# Patient Record
Sex: Male | Born: 1935 | Race: White | Hispanic: No | Smoking: Never smoker
Health system: Southern US, Community
[De-identification: ages and names within clinical notes are randomized; demographics above are authoritative.]

## PROBLEM LIST (undated history)

## (undated) DIAGNOSIS — I10 Essential (primary) hypertension: Secondary | ICD-10-CM

## (undated) DIAGNOSIS — I350 Nonrheumatic aortic (valve) stenosis: Secondary | ICD-10-CM

## (undated) DIAGNOSIS — I712 Thoracic aortic aneurysm, without rupture, unspecified: Secondary | ICD-10-CM

## (undated) DIAGNOSIS — E7800 Pure hypercholesterolemia, unspecified: Secondary | ICD-10-CM

## (undated) DIAGNOSIS — H409 Unspecified glaucoma: Secondary | ICD-10-CM

## (undated) DIAGNOSIS — N400 Enlarged prostate without lower urinary tract symptoms: Secondary | ICD-10-CM

## (undated) DIAGNOSIS — I499 Cardiac arrhythmia, unspecified: Secondary | ICD-10-CM

## (undated) DIAGNOSIS — K219 Gastro-esophageal reflux disease without esophagitis: Secondary | ICD-10-CM

## (undated) DIAGNOSIS — R011 Cardiac murmur, unspecified: Secondary | ICD-10-CM

## (undated) DIAGNOSIS — R931 Abnormal findings on diagnostic imaging of heart and coronary circulation: Secondary | ICD-10-CM

## (undated) HISTORY — DX: Gastro-esophageal reflux disease without esophagitis: K21.9

## (undated) HISTORY — DX: Benign prostatic hyperplasia without lower urinary tract symptoms: N40.0

## (undated) HISTORY — PX: APPENDECTOMY: SHX54

## (undated) HISTORY — DX: Thoracic aortic aneurysm, without rupture, unspecified: I71.20

## (undated) HISTORY — DX: Nonrheumatic aortic (valve) stenosis: I35.0

## (undated) HISTORY — DX: Abnormal findings on diagnostic imaging of heart and coronary circulation: R93.1

## (undated) HISTORY — PX: EYE SURGERY: SHX253

## (undated) HISTORY — DX: Cardiac arrhythmia, unspecified: I49.9

## (undated) HISTORY — DX: Unspecified glaucoma: H40.9

## (undated) HISTORY — DX: Cardiac murmur, unspecified: R01.1

## (undated) HISTORY — DX: Thoracic aortic aneurysm, without rupture: I71.2

## (undated) HISTORY — DX: Pure hypercholesterolemia, unspecified: E78.00

---

## 2006-08-29 ENCOUNTER — Encounter: Admission: RE | Admit: 2006-08-29 | Discharge: 2006-08-29 | Payer: Self-pay | Admitting: Orthopaedic Surgery

## 2009-02-17 ENCOUNTER — Encounter: Admission: RE | Admit: 2009-02-17 | Discharge: 2009-02-17 | Payer: Self-pay | Admitting: Cardiology

## 2010-12-17 ENCOUNTER — Encounter: Payer: Self-pay | Admitting: Orthopaedic Surgery

## 2011-09-03 ENCOUNTER — Inpatient Hospital Stay (INDEPENDENT_AMBULATORY_CARE_PROVIDER_SITE_OTHER)
Admission: RE | Admit: 2011-09-03 | Discharge: 2011-09-03 | Disposition: A | Discharge status: Home / Self Care | Payer: Medicare Other | Source: Ambulatory Visit | Attending: Emergency Medicine | Admitting: Emergency Medicine

## 2011-09-03 DIAGNOSIS — T148XXA Other injury of unspecified body region, initial encounter: Secondary | ICD-10-CM

## 2012-06-12 ENCOUNTER — Emergency Department (HOSPITAL_COMMUNITY)
Admission: EM | Admit: 2012-06-12 | Discharge: 2012-06-12 | Disposition: A | Discharge status: Home / Self Care | Payer: Medicare Other | Source: Home / Self Care

## 2012-06-12 ENCOUNTER — Encounter (HOSPITAL_COMMUNITY): Payer: Self-pay | Admitting: Emergency Medicine

## 2012-06-12 ENCOUNTER — Emergency Department (INDEPENDENT_AMBULATORY_CARE_PROVIDER_SITE_OTHER): Payer: Medicare Other

## 2012-06-12 DIAGNOSIS — R05 Cough: Secondary | ICD-10-CM

## 2012-06-12 DIAGNOSIS — J069 Acute upper respiratory infection, unspecified: Secondary | ICD-10-CM

## 2012-06-12 HISTORY — DX: Essential (primary) hypertension: I10

## 2012-06-12 MED ORDER — AZITHROMYCIN 250 MG PO TABS: 250.0000 mg | ORAL_TABLET | Freq: Every day | ORAL | Status: AC

## 2012-06-12 NOTE — ED Notes (Signed)
HERE WITH URI SX DRY COUGH AND CHEST CONGESTION,CHILLS X 10 DYS UNRELIEVED BY OTC ALKA SELTZER AND BAYER ASP.DENIES SOB OR CP

## 2012-06-12 NOTE — ED Provider Notes (Signed)
History     CSN: [id number]  Arrival date & time 06/12/12  1130   None     Chief Complaint  Patient presents with  . URI    (Consider location/radiation/quality/duration/timing/severity/associated sxs/prior treatment) The history is provided by the patient.  Keegan is a 78 y.o. male who complains of onset of cold symptoms for ten days.  scratchy sore throat + cough, non productive No pleuritic pain No wheezing + nasal congestion + post-nasal drainage No sinus pain/pressure No chest congestion No itchy/red eyes No earache No hemoptysis No SOB + chills/sweats No fever No nausea No vomiting No abdominal pain No diarrhea No skin rashes No fatigue + myalgias No headache  Reports taking aspirin for night sweats and chills the last four days.  Denies chest pain, sob, nonsmoker. Past Medical History  Diagnosis Date  . Hypertension     History reviewed. No pertinent past surgical history.  No family history on file.  History  Substance Use Topics  . Smoking status: Never Smoker   . Smokeless tobacco: Not on file  . Alcohol Use: No      Review of Systems  All other systems reviewed and are negative.    Allergies  Review of patient's allergies indicates no known allergies.  Home Medications   Current Outpatient Rx  Name Route Sig Dispense Refill  . AMLODIPINE BESYLATE 5 MG PO TABS Oral Take 5 mg by mouth daily.    Marland Kitchen FINASTERIDE 5 MG PO TABS Oral Take 5 mg by mouth daily.    Marland Kitchen LISINOPRIL 20 MG PO TABS Oral Take 20 mg by mouth daily.    Marland Kitchen METOPROLOL SUCCINATE ER 25 MG PO TB24 Oral Take 25 mg by mouth daily.    Marland Kitchen RANITIDINE HCL 300 MG PO TABS Oral Take 300 mg by mouth at bedtime.    . TRAVOPROST 0.004 % OP SOLN  1 drop at bedtime.      BP 172/77  Pulse 60  Temp 98.2 F (36.8 C) (Oral)  Resp 20  SpO2 98%  Physical Exam  Nursing note and vitals reviewed. Constitutional: He is oriented to person, place, and time. Vital signs are normal. He  appears well-developed and well-nourished. He is active and cooperative.  HENT:  Head: Normocephalic.  Right Ear: Hearing, tympanic membrane, external ear and ear canal normal.  Left Ear: Hearing, tympanic membrane, external ear and ear canal normal.  Nose: Rhinorrhea present.  Mouth/Throat: Uvula is midline and mucous membranes are normal. Posterior oropharyngeal erythema present.       Postnasal drip  Eyes: Conjunctivae are normal. Pupils are equal, round, and reactive to light. No scleral icterus.  Neck: Trachea normal. Neck supple.  Cardiovascular: Normal rate and regular rhythm.   Murmur heard. Pulmonary/Chest: Effort normal. He has wheezes in the left lower field. He has rhonchi in the right middle field and the right lower field.  Abdominal: Soft. Normal appearance and bowel sounds are normal. He exhibits no distension. There is no hepatosplenomegaly. There is no tenderness.  Lymphadenopathy:       Head (right side): No submandibular, no tonsillar, no preauricular, no posterior auricular and no occipital adenopathy present.       Head (left side): No submandibular, no tonsillar, no preauricular, no posterior auricular and no occipital adenopathy present.    He has no cervical adenopathy.  Neurological: He is alert and oriented to person, place, and time. No cranial nerve deficit or sensory deficit.  Skin: Skin is warm and dry.  Psychiatric: He has a normal mood and affect. His speech is normal and behavior is normal. Judgment and thought content normal. Cognition and memory are normal.    ED Course  Procedures (including critical care time)  Labs Reviewed - No data to display Dg Chest 2 View  06/12/2012  *RADIOLOGY REPORT*  Clinical Data: Cough for 10 days  CHEST - 2 VIEW  Comparison: CT chest of 02/17/2009  Findings: No active infiltrate or effusion is seen with slight hyperaeration present.  Mild cardiomegaly is stable.  There is little change in the dilatation of the aortic arch  which appears stable compared to the CT from 2010 with ectasia of the descending thoracic aorta as well.  There are diffuse degenerative changes throughout the thoracic spine.  IMPRESSION: Stable cardiomegaly with no change in the ectatic aortic arch and descending thoracic aorta.  No active process.  Original Report Authenticated By: Juline Patch, M.D.     1. Cough       MDM  Increase fluid intake, rest.  Begin Azithromycin.  Begin expectorant/decongestant, topical decongestant, and cough suppressant at bedtime. Antihistamines of your choice (Claritin or Zyrtec).  Tylenol or Motrin for fever/discomfort.  Followup with PCP if not improving 7 to 10 days.   Johnsie Kindred, NP 06/12/12 1355

## 2012-06-13 NOTE — ED Provider Notes (Signed)
Medical screening examination/treatment/procedure(s) were performed by non-physician practitioner and as supervising physician I was immediately available for consultation/collaboration.  Kaci Freel M. MD   Kaaliyah Kita M Icholas Irby, MD 06/13/12 2102 

## 2013-03-06 ENCOUNTER — Other Ambulatory Visit (HOSPITAL_COMMUNITY): Payer: Self-pay | Admitting: Podiatry

## 2013-03-06 DIAGNOSIS — M79609 Pain in unspecified limb: Secondary | ICD-10-CM

## 2013-03-06 DIAGNOSIS — R0989 Other specified symptoms and signs involving the circulatory and respiratory systems: Secondary | ICD-10-CM

## 2013-03-10 ENCOUNTER — Ambulatory Visit (HOSPITAL_COMMUNITY)
Admission: RE | Admit: 2013-03-10 | Discharge: 2013-03-10 | Disposition: A | Discharge status: Home / Self Care | Payer: Medicare Other | Source: Ambulatory Visit | Attending: Cardiovascular Disease | Admitting: Cardiovascular Disease

## 2013-03-10 DIAGNOSIS — R0989 Other specified symptoms and signs involving the circulatory and respiratory systems: Secondary | ICD-10-CM

## 2013-03-10 DIAGNOSIS — M79609 Pain in unspecified limb: Secondary | ICD-10-CM

## 2013-03-10 NOTE — Progress Notes (Signed)
Arterial Duplex Lower Ext. Completed. Kenndra Morris D  

## 2013-05-09 ENCOUNTER — Other Ambulatory Visit: Payer: Self-pay | Admitting: Neurosurgery

## 2013-05-09 DIAGNOSIS — M48061 Spinal stenosis, lumbar region without neurogenic claudication: Secondary | ICD-10-CM

## 2013-05-16 ENCOUNTER — Ambulatory Visit
Admission: RE | Admit: 2013-05-16 | Discharge: 2013-05-16 | Disposition: A | Discharge status: Home / Self Care | Payer: Medicare Other | Source: Ambulatory Visit | Attending: Neurosurgery | Admitting: Neurosurgery

## 2013-05-16 DIAGNOSIS — M48061 Spinal stenosis, lumbar region without neurogenic claudication: Secondary | ICD-10-CM

## 2013-12-18 ENCOUNTER — Encounter: Payer: Self-pay | Admitting: Podiatry

## 2013-12-18 ENCOUNTER — Ambulatory Visit (INDEPENDENT_AMBULATORY_CARE_PROVIDER_SITE_OTHER): Payer: Medicare Other | Admitting: Podiatry

## 2013-12-18 VITALS — BP 144/84 | HR 61 | Resp 18

## 2013-12-18 DIAGNOSIS — L97509 Non-pressure chronic ulcer of other part of unspecified foot with unspecified severity: Secondary | ICD-10-CM

## 2013-12-18 NOTE — Progress Notes (Signed)
Last Saturday that knot got real bad , Dr. Al CorpusHyatt his work on it before and it really hasn't bothered me since the last time. As of late has been more painful.  Objective: Vital signs are stable alert and oriented x3. Pulses are strongly palpable left lower extremity. Very prominent fifth metatarsal base and hypertrophic fifth metatarsal base plantarly resulting in soft tissue breakdown plantar aspect of the left foot. Mild erythema no edema cellulitis or odor does mild drainage in necrotic tissue I debrided this area today he did did not probe to bone I see no signs of osteomyelitis.  Assessment: Debridement of wound today sub-fifth metatarsal of the left foot. Ulceration.  Plan: Debridement of the wound today redressed with a dry sterile compressive dressing he was start conservative therapies and I will followup with him in 2-3 weeks

## 2014-01-15 ENCOUNTER — Ambulatory Visit (INDEPENDENT_AMBULATORY_CARE_PROVIDER_SITE_OTHER): Payer: Medicare Other | Admitting: Podiatry

## 2014-01-15 VITALS — BP 127/71 | HR 75 | Resp 16 | Ht 66.0 in | Wt 142.0 lb

## 2014-01-15 DIAGNOSIS — L97509 Non-pressure chronic ulcer of other part of unspecified foot with unspecified severity: Secondary | ICD-10-CM

## 2014-01-15 NOTE — Progress Notes (Signed)
He presents today for a followup of his ulceration sub-fifth metatarsal base of his left foot. It appears to be much improved. There is no erythema edema saline is drainage or odor on physical exam today just reactive hyperkeratosis which I debrided to bleeding today. 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  Plan: Debridement of wound today followup with me on an as-needed basis.

## 2014-02-24 ENCOUNTER — Encounter (HOSPITAL_COMMUNITY): Payer: Self-pay | Admitting: Emergency Medicine

## 2014-02-24 ENCOUNTER — Emergency Department (HOSPITAL_COMMUNITY)
Admission: EM | Admit: 2014-02-24 | Discharge: 2014-02-24 | Disposition: A | Discharge status: Home / Self Care | Payer: Medicare Other | Source: Home / Self Care | Attending: Emergency Medicine | Admitting: Emergency Medicine

## 2014-02-24 DIAGNOSIS — L02619 Cutaneous abscess of unspecified foot: Secondary | ICD-10-CM

## 2014-02-24 DIAGNOSIS — L03119 Cellulitis of unspecified part of limb: Secondary | ICD-10-CM

## 2014-02-24 DIAGNOSIS — L0390 Cellulitis, unspecified: Secondary | ICD-10-CM

## 2014-02-24 DIAGNOSIS — L02612 Cutaneous abscess of left foot: Secondary | ICD-10-CM

## 2014-02-24 MED ORDER — AMOXICILLIN-POT CLAVULANATE 875-125 MG PO TABS: 1.0000 | ORAL_TABLET | Freq: Two times a day (BID) | ORAL | Status: DC

## 2014-02-24 MED ORDER — HYDROCODONE-ACETAMINOPHEN 5-325 MG PO TABS
1.0000 | ORAL_TABLET | ORAL | Status: DC | PRN
Start: 2014-02-24 — End: 2014-03-03

## 2014-02-24 NOTE — ED Notes (Signed)
Patient has a "knot" to the side of his left foot Foot is red swollen and painful

## 2014-02-24 NOTE — Discharge Instructions (Signed)
Abscess An abscess is an infected area that contains a collection of pus and debris.It can occur in almost any part of the body. An abscess is also known as a furuncle or boil. CAUSES  An abscess occurs when tissue gets infected. This can occur from blockage of oil or sweat glands, infection of hair follicles, or a minor injury to the skin. As the body tries to fight the infection, pus collects in the area and creates pressure under the skin. This pressure causes pain. People with weakened immune systems have difficulty fighting infections and get certain abscesses more often.  SYMPTOMS Usually an abscess develops on the skin and becomes a painful mass that is red, warm, and tender. If the abscess forms under the skin, you may feel a moveable soft area under the skin. Some abscesses break open (rupture) on their own, but most will continue to get worse without care. The infection can spread deeper into the body and eventually into the bloodstream, causing you to feel ill.  DIAGNOSIS  Your caregiver will take your medical history and perform a physical exam. A sample of fluid may also be taken from the abscess to determine what is causing your infection. TREATMENT  Your caregiver may prescribe antibiotic medicines to fight the infection. However, taking antibiotics alone usually does not cure an abscess. Your caregiver may need to make a small cut (incision) in the abscess to drain the pus. In some cases, gauze is packed into the abscess to reduce pain and to continue draining the area. HOME CARE INSTRUCTIONS   Only take over-the-counter or prescription medicines for pain, discomfort, or fever as directed by your caregiver.  If you were prescribed antibiotics, take them as directed. Finish them even if you start to feel better.  If gauze is used, follow your caregiver's directions for changing the gauze.  To avoid spreading the infection:  Keep your draining abscess covered with a  bandage.  Wash your hands well.  Do not share personal care items, towels, or whirlpools with others.  Avoid skin contact with others.  Keep your skin and clothes clean around the abscess.  Keep all follow-up appointments as directed by your caregiver. SEEK MEDICAL CARE IF:   You have increased pain, swelling, redness, fluid drainage, or bleeding.  You have muscle aches, chills, or a general ill feeling.  You have a fever. MAKE SURE YOU:   Understand these instructions.  Will watch your condition.  Will get help right away if you are not doing well or get worse. Document Released: 08/23/2005 Document Revised: 05/14/2012 Document Reviewed: 01/26/2012 Exit[employer] Patient Information 2014 [employer]. Abscess Care After An abscess (also called a boil or furuncle) is an infected area that contains a collection of pus. Signs and symptoms of an abscess include pain, tenderness, redness, or hardness, or you may feel a moveable soft area under your skin. An abscess can occur anywhere in the body. The infection may spread to surrounding tissues causing cellulitis. A cut (incision) by the surgeon was made over your abscess and the pus was drained out. Gauze may have been packed into the space to provide a drain that will allow the cavity to heal from the inside outwards. The boil may be painful for 5 to 7 days. Most people with a boil do not have high fevers. Your abscess, if seen early, may not have localized, and may not have been lanced. If not, another appointment may be required for this if it does not   get better on its own or with medications. HOME CARE INSTRUCTIONS   Only take over-the-counter or prescription medicines for pain, discomfort, or fever as directed by your caregiver.  When you bathe, soak and then remove gauze or iodoform packs at least daily or as directed by your caregiver. You may then wash the wound gently with mild soapy water. Repack with gauze or do as your  caregiver directs. SEEK IMMEDIATE MEDICAL CARE IF:   You develop increased pain, swelling, redness, drainage, or bleeding in the wound site.  You develop signs of generalized infection including muscle aches, chills, fever, or a general ill feeling.  An oral temperature above 102 F (38.9 C) develops, not controlled by medication. See your caregiver for a recheck if you develop any of the symptoms described above. If medications (antibiotics) were prescribed, take them as directed. Document Released: 06/01/2005 Document Revised: 02/05/2012 Document Reviewed: 01/27/2008 Exit[employer] Patient Information 2014 [employer]. Cellulitis Cellulitis is an infection of the skin and the tissue beneath it. The infected area is usually red and tender. Cellulitis occurs most often in the arms and lower legs.  CAUSES  Cellulitis is caused by bacteria that enter the skin through cracks or cuts in the skin. The most common types of bacteria that cause cellulitis are Staphylococcus and Streptococcus. SYMPTOMS   Redness and warmth.  Swelling.  Tenderness or pain.  Fever. DIAGNOSIS  Your caregiver can usually determine what is wrong based on a physical exam. Blood tests may also be done. TREATMENT  Treatment usually involves taking an antibiotic medicine. HOME CARE INSTRUCTIONS   Take your antibiotics as directed. Finish them even if you start to feel better.  Keep the infected arm or leg elevated to reduce swelling.  Apply a warm cloth to the affected area up to 4 times per day to relieve pain.  Only take over-the-counter or prescription medicines for pain, discomfort, or fever as directed by your caregiver.  Keep all follow-up appointments as directed by your caregiver. SEEK MEDICAL CARE IF:   You notice red streaks coming from the infected area.  Your red area gets larger or turns dark in color.  Your bone or joint underneath the infected area becomes painful after the skin has  healed.  Your infection returns in the same area or another area.  You notice a swollen bump in the infected area.  You develop new symptoms. SEEK IMMEDIATE MEDICAL CARE IF:   You have a fever.  You feel very sleepy.  You develop vomiting or diarrhea.  You have a general ill feeling (malaise) with muscle aches and pains. MAKE SURE YOU:   Understand these instructions.  Will watch your condition.  Will get help right away if you are not doing well or get worse. Document Released: 08/23/2005 Document Revised: 05/14/2012 Document Reviewed: 01/29/2012 ExitCare Patient Information 2014 [employer].  

## 2014-02-24 NOTE — ED Provider Notes (Signed)
CSN: [id number]     Arrival date & time 02/24/14  [id number] History   First MD Initiated Contact with Patient 02/24/14 2128     Chief Complaint  Patient presents with  . Foot Pain   (Consider location/radiation/quality/duration/timing/severity/associated sxs/prior Treatment) HPI Comments: C/O pain left foot with redness and puffiness to chronic lesion for 1 week   Past Medical History  Diagnosis Date  . Hypertension    History reviewed. No pertinent past surgical history. No family history on file. History  Substance Use Topics  . Smoking status: Never Smoker   . Smokeless tobacco: Not on file  . Alcohol Use: No    Review of Systems  Constitutional: Negative.   Skin:       As above    Allergies  Review of patient's allergies indicates no known allergies.  Home Medications   Current Outpatient Rx  Name  Route  Sig  Dispense  Refill  . amLODipine (NORVASC) 5 MG tablet   Oral   Take 5 mg by mouth daily.         Marland Kitchen. amoxicillin-clavulanate (AUGMENTIN) 875-125 MG per tablet   Oral   Take 1 tablet by mouth every 12 (twelve) hours.   14 tablet   0   . dutasteride (AVODART) 0.5 MG capsule   Oral   Take 0.5 mg by mouth daily.         . finasteride (PROSCAR) 5 MG tablet   Oral   Take 5 mg by mouth daily.         Marland Kitchen. HYDROcodone-acetaminophen (NORCO/VICODIN) 5-325 MG per tablet   Oral   Take 1 tablet by mouth every 4 (four) hours as needed.   15 tablet   0   . lisinopril (PRINIVIL,ZESTRIL) 20 MG tablet   Oral   Take 20 mg by mouth daily.         . metoprolol succinate (TOPROL-XL) 25 MG 24 hr tablet   Oral   Take 25 mg by mouth daily.         . pantoprazole (PROTONIX) 40 MG tablet   Oral   Take 40 mg by mouth daily.         . ranitidine (ZANTAC) 300 MG tablet   Oral   Take 300 mg by mouth at bedtime.         . travoprost, benzalkonium, (TRAVATAN) 0.004 % ophthalmic solution      1 drop at bedtime.          BP 151/99  Pulse 100  Temp(Src)  97.3 F (36.3 C) (Oral)  Resp 16  SpO2 100% Physical Exam  Nursing note and vitals reviewed. Constitutional: He is oriented to person, place, and time. He appears well-developed and well-nourished. No distress.  Neurological: He is alert and oriented to person, place, and time.  Skin: Skin is warm and dry.  Ball shaped lesion to the left lateral foot for 30 yrs, Now with fluid and mild swelling to foot with lymphangitis.    ED Course  INCISION AND DRAINAGE Date/Time: 02/24/2014 10:21 PM Performed by: Phineas RealMABE, Raymonde Hamblin Authorized by: Leslee HomeKELLER, Lambros Cerro C Consent: Verbal consent obtained. Risks and benefits: risks, benefits and alternatives were discussed Consent given by: patient Patient understanding: patient states understanding of the procedure being performed Patient identity confirmed: verbally with patient Type: abscess Body area: lower extremity Location details: left foot Anesthesia: local infiltration Local anesthetic: lidocaine 2% with epinephrine Anesthetic total: 3 ml Patient sedated: no Scalpel size: 11 Incision type: single  straight Complexity: simple Drainage: purulent and  bloody Drainage amount: moderate Wound treatment: wound left open Packing material: none Patient tolerance: Patient tolerated the procedure well with no immediate complications.   (including critical care time) Labs Review Labs Reviewed  CULTURE, ROUTINE-ABSCESS   Imaging Review No results found.   MDM   1. Abscess of left foot   2. Cellulitis      Bandage tonight Warm water soaks tomorrow aUgmentin 875 bid norco 5 mg #15 prn If worse rechk promptly Culture pending  Hayden Rasmussen, NP 02/24/14 2222

## 2014-02-24 NOTE — ED Provider Notes (Signed)
Medical screening examination/treatment/procedure(s) were performed by non-physician practitioner and as supervising physician I was immediately available for consultation/collaboration.  Malakai Schoenherr, M.D.  Chrisandra Wiemers C Amyre Segundo, MD 02/24/14 2346 

## 2014-02-26 ENCOUNTER — Inpatient Hospital Stay (HOSPITAL_COMMUNITY)
Admission: AD | Admit: 2014-02-26 | Discharge: 2014-03-04 | DRG: 501 | Disposition: A | Discharge status: Home / Self Care | Payer: Medicare Other | Source: Ambulatory Visit | Attending: Orthopaedic Surgery | Admitting: Orthopaedic Surgery

## 2014-02-26 DIAGNOSIS — Z79899 Other long term (current) drug therapy: Secondary | ICD-10-CM

## 2014-02-26 DIAGNOSIS — L02619 Cutaneous abscess of unspecified foot: Secondary | ICD-10-CM | POA: Diagnosis present

## 2014-02-26 DIAGNOSIS — L03119 Cellulitis of unspecified part of limb: Secondary | ICD-10-CM

## 2014-02-26 DIAGNOSIS — R2242 Localized swelling, mass and lump, left lower limb: Secondary | ICD-10-CM | POA: Diagnosis present

## 2014-02-26 DIAGNOSIS — M799 Soft tissue disorder, unspecified: Principal | ICD-10-CM | POA: Diagnosis present

## 2014-02-26 DIAGNOSIS — I10 Essential (primary) hypertension: Secondary | ICD-10-CM | POA: Diagnosis present

## 2014-02-26 LAB — CBC WITH DIFFERENTIAL/PLATELET
BASOS ABS: 0 10*3/uL (ref 0.0–0.1)
Basophils Relative: 0 % (ref 0–1)
EOS ABS: 0.1 10*3/uL (ref 0.0–0.7)
EOS PCT: 2 % (ref 0–5)
HCT: 35.2 % — ABNORMAL LOW (ref 39.0–52.0)
Hemoglobin: 12.3 g/dL — ABNORMAL LOW (ref 13.0–17.0)
Lymphocytes Relative: 16 % (ref 12–46)
Lymphs Abs: 1.4 10*3/uL (ref 0.7–4.0)
MCH: 29 pg (ref 26.0–34.0)
MCHC: 34.9 g/dL (ref 30.0–36.0)
MCV: 83 fL (ref 78.0–100.0)
Monocytes Absolute: 1.4 10*3/uL — ABNORMAL HIGH (ref 0.1–1.0)
Monocytes Relative: 16 % — ABNORMAL HIGH (ref 3–12)
NEUTROS PCT: 66 % (ref 43–77)
Neutro Abs: 5.8 10*3/uL (ref 1.7–7.7)
PLATELETS: 179 10*3/uL (ref 150–400)
RBC: 4.24 MIL/uL (ref 4.22–5.81)
RDW: 14.6 % (ref 11.5–15.5)
WBC: 8.7 10*3/uL (ref 4.0–10.5)

## 2014-02-26 MED ORDER — SORBITOL 70 % SOLN
30.0000 mL | Freq: Every day | Status: DC | PRN
Start: 2014-02-26 — End: 2014-03-04

## 2014-02-26 MED ORDER — VANCOMYCIN HCL 10 G IV SOLR
1250.0000 mg | Freq: Once | INTRAVENOUS | Status: AC
  Administered 2014-02-26: 1250 mg via INTRAVENOUS
  Filled 2014-02-26: qty 1250

## 2014-02-26 MED ORDER — SENNA 8.6 MG PO TABS
1.0000 | ORAL_TABLET | Freq: Two times a day (BID) | ORAL | Status: DC
  Administered 2014-02-27 – 2014-03-04 (×9): 8.6 mg via ORAL
  Filled 2014-02-26 (×13): qty 1

## 2014-02-26 MED ORDER — MAGNESIUM CITRATE PO SOLN: 1.0000 | Freq: Once | ORAL | Status: AC | PRN

## 2014-02-26 MED ORDER — ONDANSETRON HCL 4 MG/2ML IJ SOLN
4.0000 mg | Freq: Four times a day (QID) | INTRAMUSCULAR | Status: DC | PRN
  Administered 2014-03-03 (×2): 4 mg via INTRAVENOUS
  Filled 2014-02-26 (×5): qty 2

## 2014-02-26 MED ORDER — HYDROCODONE-ACETAMINOPHEN 5-325 MG PO TABS
1.0000 | ORAL_TABLET | ORAL | Status: DC | PRN
  Administered 2014-03-02: 2 via ORAL
  Administered 2014-03-03: 1 via ORAL
  Administered 2014-03-03: 2 via ORAL
  Filled 2014-02-26: qty 1
  Filled 2014-02-26 (×2): qty 2

## 2014-02-26 MED ORDER — ONDANSETRON HCL 4 MG PO TABS
4.0000 mg | ORAL_TABLET | Freq: Four times a day (QID) | ORAL | Status: DC | PRN
  Administered 2014-02-27 – 2014-03-02 (×2): 4 mg via ORAL
  Filled 2014-02-26 (×3): qty 1

## 2014-02-26 MED ORDER — DIPHENHYDRAMINE HCL 12.5 MG/5ML PO ELIX: 25.0000 mg | ORAL_SOLUTION | ORAL | Status: DC | PRN

## 2014-02-26 MED ORDER — METOCLOPRAMIDE HCL 10 MG PO TABS: 5.0000 mg | ORAL_TABLET | Freq: Three times a day (TID) | ORAL | Status: DC | PRN

## 2014-02-26 MED ORDER — SODIUM CHLORIDE 0.9 % IV SOLN
INTRAVENOUS | Status: DC
  Administered 2014-02-26 – 2014-03-04 (×3): via INTRAVENOUS

## 2014-02-26 MED ORDER — MORPHINE SULFATE 2 MG/ML IJ SOLN
1.0000 mg | INTRAMUSCULAR | Status: DC | PRN
  Administered 2014-03-03: 1 mg via INTRAVENOUS
  Filled 2014-02-26: qty 1

## 2014-02-26 MED ORDER — POLYETHYLENE GLYCOL 3350 17 G PO PACK: 17.0000 g | PACK | Freq: Every day | ORAL | Status: DC | PRN

## 2014-02-26 MED ORDER — METOCLOPRAMIDE HCL 5 MG/ML IJ SOLN
5.0000 mg | Freq: Three times a day (TID) | INTRAMUSCULAR | Status: DC | PRN
  Administered 2014-03-03 (×2): 10 mg via INTRAVENOUS
  Filled 2014-02-26 (×2): qty 2

## 2014-02-26 MED ORDER — OXYCODONE HCL 5 MG PO TABS
5.0000 mg | ORAL_TABLET | ORAL | Status: DC | PRN
  Administered 2014-03-02: 10 mg via ORAL

## 2014-02-26 NOTE — Progress Notes (Signed)
Orthopedic Tech Progress Note Patient Details:  Jorge Long 05/20/1933 [id number]  Ortho Devices Ortho Device/Splint Location: trapeze bar patient helper Ortho Device/Splint Interventions: Application   Nikki Domrawford, Lilley Hubble 02/26/2014, 3:59 PM

## 2014-02-26 NOTE — H&P (Addendum)
   ORTHOPAEDIC HISTORY AND PHYSICAL   Chief Complaint: left foot mass  HPI: Jorge Long is a 80 y.o. male who complains of left foot mass for the last year that's been drained multiple times by a podiatrist and urgent care.  Comes in with recurrent mass with drainage.  Denies any recent trauma to the foot.  Endorses worsening redness of foot and chills.    Past Medical History  Diagnosis Date  . Hypertension    No past surgical history on file. History   Social History  . Marital Status: Widowed    Spouse Name: N/A    Number of Children: N/A  . Years of Education: N/A   Social History Main Topics  . Smoking status: Never Smoker   . Smokeless tobacco: Not on file  . Alcohol Use: No  . Drug Use: No  . Sexual Activity: Not on file   Other Topics Concern  . Not on file   Social History Narrative  . No narrative on file   No family history on file. No Known Allergies Prior to Admission medications   Medication Sig Start Date End Date Taking? Authorizing Provider  amLODipine (NORVASC) 5 MG tablet Take 5 mg by mouth daily.   Yes Historical Provider, MD  amoxicillin-clavulanate (AUGMENTIN) 875-125 MG per tablet Take 1 tablet by mouth every 12 (twelve) hours. 02/24/14  Yes Hayden Rasmussenavid Mabe, NP  dutasteride (AVODART) 0.5 MG capsule Take 0.5 mg by mouth daily.   Yes Historical Provider, MD  finasteride (PROSCAR) 5 MG tablet Take 5 mg by mouth daily.   Yes Historical Provider, MD  HYDROcodone-acetaminophen (NORCO/VICODIN) 5-325 MG per tablet Take 1 tablet by mouth every 4 (four) hours as needed. 02/24/14  Yes Hayden Rasmussenavid Mabe, NP  lisinopril (PRINIVIL,ZESTRIL) 20 MG tablet Take 20 mg by mouth daily.   Yes Historical Provider, MD  metoprolol succinate (TOPROL-XL) 25 MG 24 hr tablet Take 25 mg by mouth daily.   Yes Historical Provider, MD  ranitidine (ZANTAC) 300 MG tablet Take 300 mg by mouth at bedtime.   Yes Historical Provider, MD  travoprost, benzalkonium, (TRAVATAN) 0.004 % ophthalmic  solution 1 drop at bedtime.   Yes Historical Provider, MD   No results found.  Positive ROS: All other systems have been reviewed and were otherwise negative with the exception of those mentioned in the HPI and as above.  Physical Exam: General: Alert, no acute distress Cardiovascular: No pedal edema Respiratory: No cyanosis, no use of accessory musculature GI: No organomegaly, abdomen is soft and non-tender Skin: No lesions in the area of chief complaint Neurologic: Sensation intact distally Psychiatric: Patient is competent for consent with normal mood and affect Lymphatic: No axillary or cervical lymphadenopathy  MUSCULOSKELETAL:   Left foot - cellulitis - wwp, nvi - large firm mass on the lateral border of foot with clear drainage - painful to palpation  Assessment: Left foot mass  Plan: - XRs from office c/w cortical thickening of 5th MT - will start IV abx for cellulitis - MRI of foot - elevate extremity  N. Glee ArvinMichael Xu, MD [facility] [phone] 10:16 PM

## 2014-02-26 NOTE — Progress Notes (Signed)
ANTIBIOTIC CONSULT NOTE - INITIAL  Pharmacy Consult for Vancomycin Indication: cellulitis  No Known Allergies  Patient Measurements: Height: 5\' 4"  (162.6 cm) Weight: 140 lb (63.504 kg) (estimated by patient) IBW/kg (Calculated) : 59.2  Vital Signs: Temp: 97.7 F (36.5 C) (04/02 1526) BP: 144/86 mmHg (04/02 1526)  Labs:   cmet due 4/3 am   WBC 8.7   CPR pending  Microbiology: Recent Results (from the past 720 hour(s))  CULTURE, ROUTINE-ABSCESS     Status: None   Collection Time    02/24/14 10:16 PM      Result Value Ref Range Status   Specimen Description ABSCESS FOOT RIGHT   Final   Special Requests Normal   Final   Gram Stain     Final   Value: NO WBC SEEN     NO SQUAMOUS EPITHELIAL CELLS SEEN     NO ORGANISMS SEEN     Performed at [facility]   Culture     Final   Value: MODERATE STAPHYLOCOCCUS AUREUS     Note: RIFAMPIN AND GENTAMICIN SHOULD NOT BE USED AS SINGLE DRUGS FOR TREATMENT OF STAPH INFECTIONS.     Performed at [facility]   Report Status PENDING   Incomplete    Medical History: Past Medical History  Diagnosis Date  . Hypertension    Assessment:  80 yr old man admitted with left foot cellulitis.  Seen at Urgent Care on 3/31 pm.  Had I&D, and was given Rx for Augmentin.  He reports taking 2 doses on 4/1 and 1 this morning, but foot worsened.  Culture growing moderate Staph aureus.   Admit note pending.  Cmet due in am. (Earlier I thought it had been ordered on admission.) Dosing calculations done assuming serum creatinine 1.0, estimated creatinine clearance ~50 ml/min.  Goal of Therapy:  Vancomycin trough level 10-15 mcg/ml  Plan:    Vancomycin 1250 mg IV x 1 given at ~5pm.     Will continue Vancomycin 1250 mg IV q24hrs.   Will follow up chemistries on 4/3 and adjust if necessary.   Follow up Staph sensitivities.   Follow up MRI.  If osteomyelitis, will increase target trough levels to 15-20 mcg/ml.  Dennie FettersEgan, Koury Roddy Donovan ,  [place] Pager: [phone]  02/26/2014,4:17 PM

## 2014-02-27 ENCOUNTER — Encounter (HOSPITAL_COMMUNITY): Payer: Self-pay

## 2014-02-27 ENCOUNTER — Inpatient Hospital Stay (HOSPITAL_COMMUNITY): Payer: Medicare Other

## 2014-02-27 LAB — COMPREHENSIVE METABOLIC PANEL
ALBUMIN: 2.8 g/dL — AB (ref 3.5–5.2)
ALT: 7 U/L (ref 0–53)
AST: 13 U/L (ref 0–37)
Alkaline Phosphatase: 44 U/L (ref 39–117)
BUN: 15 mg/dL (ref 6–23)
CALCIUM: 8.3 mg/dL — AB (ref 8.4–10.5)
CHLORIDE: 102 meq/L (ref 96–112)
CO2: 22 meq/L (ref 19–32)
Creatinine, Ser: 0.72 mg/dL (ref 0.50–1.35)
GFR calc Af Amer: >90 mL/min (ref >90)
GFR calc non Af Amer: 86 mL/min — ABNORMAL LOW (ref >90)
Glucose, Bld: 108 mg/dL — ABNORMAL HIGH (ref 70–99)
Potassium: 3.8 mEq/L (ref 3.7–5.3)
SODIUM: 136 meq/L — AB (ref 137–147)
Total Bilirubin: 0.5 mg/dL (ref 0.3–1.2)
Total Protein: 5.9 g/dL — ABNORMAL LOW (ref 6.0–8.3)

## 2014-02-27 LAB — CULTURE, ROUTINE-ABSCESS
GRAM STAIN: NONE SEEN
SPECIAL REQUESTS: NORMAL

## 2014-02-27 LAB — C-REACTIVE PROTEIN: CRP: 13.4 mg/dL — ABNORMAL HIGH (ref <0.60)

## 2014-02-27 MED ORDER — RANITIDINE HCL 150 MG/10ML PO SYRP
300.0000 mg | ORAL_SOLUTION | Freq: Every day | ORAL | Status: DC
  Administered 2014-02-27 – 2014-03-03 (×5): 300 mg via ORAL
  Filled 2014-02-27 (×6): qty 20

## 2014-02-27 MED ORDER — AMLODIPINE BESYLATE 5 MG PO TABS
5.0000 mg | ORAL_TABLET | Freq: Every day | ORAL | Status: DC
  Administered 2014-02-27 – 2014-03-03 (×5): 5 mg via ORAL
  Filled 2014-02-27 (×6): qty 1

## 2014-02-27 MED ORDER — FINASTERIDE 5 MG PO TABS
5.0000 mg | ORAL_TABLET | Freq: Every day | ORAL | Status: DC
  Administered 2014-02-27 – 2014-03-04 (×6): 5 mg via ORAL
  Filled 2014-02-27 (×6): qty 1

## 2014-02-27 MED ORDER — PANTOPRAZOLE SODIUM 40 MG PO TBEC
40.0000 mg | DELAYED_RELEASE_TABLET | Freq: Every day | ORAL | Status: DC
  Administered 2014-02-27 – 2014-03-04 (×6): 40 mg via ORAL
  Filled 2014-02-27 (×6): qty 1

## 2014-02-27 MED ORDER — METOPROLOL SUCCINATE ER 25 MG PO TB24
25.0000 mg | ORAL_TABLET | Freq: Every day | ORAL | Status: DC
  Administered 2014-02-27 – 2014-03-04 (×6): 25 mg via ORAL
  Filled 2014-02-27 (×6): qty 1

## 2014-02-27 MED ORDER — VANCOMYCIN HCL 10 G IV SOLR
1250.0000 mg | INTRAVENOUS | Status: DC
  Administered 2014-02-27 – 2014-02-28 (×2): 1250 mg via INTRAVENOUS
  Filled 2014-02-27 (×3): qty 1250

## 2014-02-27 MED ORDER — LATANOPROST 0.005 % OP SOLN
1.0000 [drp] | Freq: Every day | OPHTHALMIC | Status: DC
  Administered 2014-02-28 – 2014-03-03 (×4): 1 [drp] via OPHTHALMIC
  Filled 2014-02-27 (×2): qty 2.5

## 2014-02-27 MED ORDER — GADOBENATE DIMEGLUMINE 529 MG/ML IV SOLN
15.0000 mL | Freq: Once | INTRAVENOUS | Status: AC | PRN
  Administered 2014-02-27: 13 mL via INTRAVENOUS

## 2014-02-27 MED ORDER — LISINOPRIL 20 MG PO TABS
20.0000 mg | ORAL_TABLET | Freq: Every day | ORAL | Status: DC
  Administered 2014-02-27 – 2014-03-04 (×6): 20 mg via ORAL
  Filled 2014-02-27 (×6): qty 1

## 2014-02-27 MED ORDER — DUTASTERIDE 0.5 MG PO CAPS: 0.5000 mg | ORAL_CAPSULE | Freq: Every day | ORAL | Status: DC

## 2014-02-27 NOTE — Progress Notes (Signed)
   Subjective:  Patient reports as feeling better.  Foot less painful.  Objective:   VITALS:   Filed Vitals:   02/26/14 1526 02/26/14 2046 02/27/14 0601  BP: 144/86 117/73 127/78  Pulse:  84 80  Temp: 97.7 F (36.5 C) 97.9 F (36.6 C) 98.9 F (37.2 C)  TempSrc: Oral    Resp: 14 16 16   Height: 5\' 4"  (1.626 m)    Weight: 63.504 kg (140 lb)    SpO2: 96% 97% 96%    Cellulitis slightly improved. otherwise exam is stable   Lab Results  Component Value Date   WBC 8.7 02/26/2014   HGB 12.3* 02/26/2014   HCT 35.2* 02/26/2014   MCV 83.0 02/26/2014   PLT 179 02/26/2014     Assessment/Plan:      May have diet for now IV abx MRI ordered  Problem List Items Addressed This Visit   None       Cheral AlmasXu, Gerber Penza Michael 02/27/2014, 9:21 AM [phone]

## 2014-02-27 NOTE — Progress Notes (Signed)
Utilization review completed. Allani Reber, RN, BSN. 

## 2014-02-27 NOTE — Care Management Note (Signed)
CARE MANAGEMENT NOTE 02/27/2014  Patient:  Thereasa DistanceCOBLE,Delois   Account Number:  [id number]  Date Initiated:  02/27/2014  Documentation initiated by:  Vance PeperBRADY,Simcha Speir  Subjective/Objective Assessment:   80 yr old male admitted with left foot mass     Action/Plan:   CM will continue to monitor.   Anticipated DC Date:     Anticipated DC Plan:           Choice offered to / List presented to:             Status of service:  In process, will continue to follow

## 2014-02-28 LAB — COMPREHENSIVE METABOLIC PANEL
ALT: 8 U/L (ref 0–53)
AST: 14 U/L (ref 0–37)
Albumin: 3 g/dL — ABNORMAL LOW (ref 3.5–5.2)
Alkaline Phosphatase: 46 U/L (ref 39–117)
BILIRUBIN TOTAL: 0.5 mg/dL (ref 0.3–1.2)
BUN: 10 mg/dL (ref 6–23)
CO2: 23 mEq/L (ref 19–32)
CREATININE: 0.68 mg/dL (ref 0.50–1.35)
Calcium: 8.8 mg/dL (ref 8.4–10.5)
Chloride: 100 mEq/L (ref 96–112)
GFR calc Af Amer: >90 mL/min (ref >90)
GFR calc non Af Amer: 88 mL/min — ABNORMAL LOW (ref >90)
Glucose, Bld: 90 mg/dL (ref 70–99)
Potassium: 3.7 mEq/L (ref 3.7–5.3)
Sodium: 136 mEq/L — ABNORMAL LOW (ref 137–147)
Total Protein: 6.2 g/dL (ref 6.0–8.3)

## 2014-02-28 MED ORDER — CLINDAMYCIN PHOSPHATE 600 MG/50ML IV SOLN
600.0000 mg | Freq: Three times a day (TID) | INTRAVENOUS | Status: DC
  Administered 2014-02-28 – 2014-03-02 (×5): 600 mg via INTRAVENOUS
  Filled 2014-02-28 (×10): qty 50

## 2014-02-28 NOTE — ED Notes (Addendum)
Abscess culture: Mod. Staph. Aureus.  Pt. treated with Augmentin.  Message sent to Hayden Rasmussenavid Mabe NP. Jorge Long, Jorge Long 02/28/2014 Discussed with Onalee Huaavid and he said it should be OK. 03/02/2014

## 2014-02-28 NOTE — Progress Notes (Addendum)
   Subjective:  Patient denies complaints  Objective:   VITALS:   Filed Vitals:   02/27/14 0601 02/27/14 1545 02/27/14 2017 02/28/14 0529  BP: 127/78 151/84 141/74 134/77  Pulse: 80 82 83 83  Temp: 98.9 F (37.2 C) 98.1 F (36.7 C) 98.4 F (36.9 C) 98 F (36.7 C)  TempSrc:   Oral Oral  Resp: 16 16 18 18   Height:      Weight:      SpO2: 96% 99% 98% 98%    cellulitis is slightly improved. mass is firm and nonmobile without drainage   Lab Results  Component Value Date   WBC 8.7 02/26/2014   HGB 12.3* 02/26/2014   HCT 35.2* 02/26/2014   MCV 83.0 02/26/2014   PLT 179 02/26/2014     Assessment/Plan:  - Up with PT/OT - DVT ppx - SCDs, ambulation - WBAT left lower extremity - continue IV abx for cellulitis - Dr. Magnus IvanBlackman also examined patient and felt that surgical excision of mass is reasonable - MRI reviewed and shows necrotic mass with scant fluid - discussed r/b/a to surgical excision and he wishes to proceed - patient having difficulty with weight bearing, shoe wear and recurrence  Problem List Items Addressed This Visit   None       Cheral AlmasXu, Shyler Hamill Michael 02/28/2014, 10:31 AM [phone]

## 2014-03-01 LAB — COMPREHENSIVE METABOLIC PANEL
ALT: 7 U/L (ref 0–53)
AST: 13 U/L (ref 0–37)
Albumin: 2.8 g/dL — ABNORMAL LOW (ref 3.5–5.2)
Alkaline Phosphatase: 47 U/L (ref 39–117)
BILIRUBIN TOTAL: 0.2 mg/dL — AB (ref 0.3–1.2)
BUN: 17 mg/dL (ref 6–23)
CHLORIDE: 101 meq/L (ref 96–112)
CO2: 23 meq/L (ref 19–32)
Calcium: 8.5 mg/dL (ref 8.4–10.5)
Creatinine, Ser: 0.82 mg/dL (ref 0.50–1.35)
GFR calc Af Amer: >90 mL/min (ref >90)
GFR calc non Af Amer: 82 mL/min — ABNORMAL LOW (ref >90)
Glucose, Bld: 96 mg/dL (ref 70–99)
POTASSIUM: 4.2 meq/L (ref 3.7–5.3)
SODIUM: 136 meq/L — AB (ref 137–147)
Total Protein: 5.9 g/dL — ABNORMAL LOW (ref 6.0–8.3)

## 2014-03-01 LAB — GLUCOSE, CAPILLARY: Glucose-Capillary: 124 mg/dL — ABNORMAL HIGH (ref 70–99)

## 2014-03-01 LAB — MRSA PCR SCREENING: MRSA by PCR: NEGATIVE

## 2014-03-01 MED ORDER — CHLORHEXIDINE GLUCONATE 4 % EX LIQD
60.0000 mL | Freq: Once | CUTANEOUS | Status: DC
  Filled 2014-03-01: qty 60

## 2014-03-01 MED ORDER — SODIUM CHLORIDE 0.9 % IV SOLN
INTRAVENOUS | Status: DC
  Administered 2014-03-02: 12:00:00 via INTRAVENOUS

## 2014-03-01 MED ORDER — CLINDAMYCIN PHOSPHATE 900 MG/50ML IV SOLN
900.0000 mg | INTRAVENOUS | Status: DC
  Filled 2014-03-01: qty 50

## 2014-03-01 MED ORDER — CHLORHEXIDINE GLUCONATE 4 % EX LIQD
60.0000 mL | Freq: Once | CUTANEOUS | Status: AC
  Administered 2014-03-02: 4 via TOPICAL
  Filled 2014-03-01: qty 60

## 2014-03-01 MED ORDER — SODIUM CHLORIDE 0.9 % IV SOLN: INTRAVENOUS | Status: DC

## 2014-03-01 NOTE — Progress Notes (Signed)
Subjective:  Patient denies complaints  Objective:   VITALS:   Filed Vitals:   02/28/14 1953 02/28/14 1956 02/28/14 2124 03/01/14 0451  BP: 131/67 131/67 112/65 127/71  Pulse:   68 65  Temp:   98.3 F (36.8 C) 97.6 F (36.4 C)  TempSrc:   Oral Oral  Resp:   18 18  Height:      Weight:      SpO2:   96% 97%    cellulitis is slightly improved. mass is firm and nonmobile without drainage   Lab Results  Component Value Date   WBC 8.7 02/26/2014   HGB 12.3* 02/26/2014   HCT 35.2* 02/26/2014   MCV 83.0 02/26/2014   PLT 179 02/26/2014     Assessment/Plan:  - will plan for surgical excision tomorrow - DVT ppx - SCDs, ambulation - WBAT left lower extremity - continue IV abx for cellulitis - Dr. Magnus IvanBlackman also examined patient and felt that surgical excision of mass is reasonable - MRI reviewed and shows necrotic mass with scant fluid - discussed r/b/a to surgical excision and he wishes to proceed - patient having difficulty with weight bearing, shoe wear and recurrence  Problem List Items Addressed This Visit   None       Cheral AlmasXu, Naiping Michael 03/01/2014, 8:57 AM [phone]

## 2014-03-02 ENCOUNTER — Inpatient Hospital Stay (HOSPITAL_COMMUNITY): Payer: Medicare Other | Admitting: Anesthesiology

## 2014-03-02 ENCOUNTER — Encounter (HOSPITAL_COMMUNITY): Payer: Medicare Other | Admitting: Anesthesiology

## 2014-03-02 ENCOUNTER — Encounter (HOSPITAL_COMMUNITY)
Admission: AD | Disposition: A | Discharge status: Home / Self Care | Payer: Self-pay | Source: Ambulatory Visit | Attending: Orthopaedic Surgery

## 2014-03-02 HISTORY — PX: I&D EXTREMITY: SHX5045

## 2014-03-02 SURGERY — IRRIGATION AND DEBRIDEMENT EXTREMITY
Anesthesia: General | Site: Foot | Laterality: Left

## 2014-03-02 MED ORDER — FENTANYL CITRATE 0.05 MG/ML IJ SOLN
INTRAMUSCULAR | Status: AC
  Filled 2014-03-02: qty 2

## 2014-03-02 MED ORDER — LACTATED RINGERS IV SOLN
INTRAVENOUS | Status: DC
  Administered 2014-03-02: 50 mL/h via INTRAVENOUS

## 2014-03-02 MED ORDER — ONDANSETRON HCL 4 MG/2ML IJ SOLN
INTRAMUSCULAR | Status: DC | PRN
  Administered 2014-03-02: 4 mg via INTRAVENOUS

## 2014-03-02 MED ORDER — FINASTERIDE 5 MG PO TABS: 5.0000 mg | ORAL_TABLET | Freq: Every day | ORAL | Status: DC

## 2014-03-02 MED ORDER — DIPHENHYDRAMINE HCL 12.5 MG/5ML PO ELIX: 25.0000 mg | ORAL_SOLUTION | ORAL | Status: DC | PRN

## 2014-03-02 MED ORDER — EPHEDRINE SULFATE 50 MG/ML IJ SOLN
INTRAMUSCULAR | Status: DC | PRN
  Administered 2014-03-02 (×4): 10 mg via INTRAVENOUS

## 2014-03-02 MED ORDER — AMLODIPINE BESYLATE 5 MG PO TABS: 5.0000 mg | ORAL_TABLET | Freq: Every day | ORAL | Status: DC

## 2014-03-02 MED ORDER — EPHEDRINE SULFATE 50 MG/ML IJ SOLN
INTRAMUSCULAR | Status: AC
  Filled 2014-03-02: qty 1

## 2014-03-02 MED ORDER — SENNA 8.6 MG PO TABS: 1.0000 | ORAL_TABLET | Freq: Two times a day (BID) | ORAL | Status: DC

## 2014-03-02 MED ORDER — OXYCODONE HCL 5 MG PO TABS: 5.0000 mg | ORAL_TABLET | ORAL | Status: DC | PRN

## 2014-03-02 MED ORDER — SODIUM CHLORIDE 0.9 % IV SOLN: INTRAVENOUS | Status: DC

## 2014-03-02 MED ORDER — TRAVOPROST 0.004 % OP SOLN: 1.0000 [drp] | Freq: Every day | OPHTHALMIC | Status: DC

## 2014-03-02 MED ORDER — OXYCODONE HCL 5 MG PO TABS
ORAL_TABLET | ORAL | Status: AC
  Filled 2014-03-02: qty 1

## 2014-03-02 MED ORDER — SODIUM CHLORIDE 0.9 % IJ SOLN
INTRAMUSCULAR | Status: AC
  Filled 2014-03-02: qty 10

## 2014-03-02 MED ORDER — PROPOFOL 10 MG/ML IV BOLUS
INTRAVENOUS | Status: AC
  Filled 2014-03-02: qty 20

## 2014-03-02 MED ORDER — PROPOFOL 10 MG/ML IV BOLUS
INTRAVENOUS | Status: DC | PRN
  Administered 2014-03-02: 100 mg via INTRAVENOUS

## 2014-03-02 MED ORDER — HYDROCODONE-ACETAMINOPHEN 5-325 MG PO TABS: 1.0000 | ORAL_TABLET | ORAL | Status: DC | PRN

## 2014-03-02 MED ORDER — SODIUM CHLORIDE 0.9 % IR SOLN
Status: DC | PRN
  Administered 2014-03-02: 3000 mL

## 2014-03-02 MED ORDER — LIDOCAINE HCL (CARDIAC) 20 MG/ML IV SOLN
INTRAVENOUS | Status: DC | PRN
  Administered 2014-03-02: 50 mg via INTRAVENOUS

## 2014-03-02 MED ORDER — CLINDAMYCIN HCL 300 MG PO CAPS
600.0000 mg | ORAL_CAPSULE | Freq: Three times a day (TID) | ORAL | Status: DC
  Administered 2014-03-02 – 2014-03-04 (×5): 600 mg via ORAL
  Filled 2014-03-02 (×9): qty 2

## 2014-03-02 MED ORDER — METOCLOPRAMIDE HCL 5 MG PO TABS: 5.0000 mg | ORAL_TABLET | Freq: Three times a day (TID) | ORAL | Status: DC | PRN

## 2014-03-02 MED ORDER — METOCLOPRAMIDE HCL 5 MG/ML IJ SOLN: 5.0000 mg | Freq: Three times a day (TID) | INTRAMUSCULAR | Status: DC | PRN

## 2014-03-02 MED ORDER — FENTANYL CITRATE 0.05 MG/ML IJ SOLN
INTRAMUSCULAR | Status: DC | PRN
  Administered 2014-03-02: 100 ug via INTRAVENOUS
  Administered 2014-03-02: 50 ug via INTRAVENOUS

## 2014-03-02 MED ORDER — CLINDAMYCIN PHOSPHATE 900 MG/50ML IV SOLN
INTRAVENOUS | Status: DC | PRN
  Administered 2014-03-02: 900 mg via INTRAVENOUS

## 2014-03-02 MED ORDER — LISINOPRIL 20 MG PO TABS: 20.0000 mg | ORAL_TABLET | Freq: Every day | ORAL | Status: DC

## 2014-03-02 MED ORDER — FENTANYL CITRATE 0.05 MG/ML IJ SOLN
INTRAMUSCULAR | Status: AC
  Filled 2014-03-02: qty 5

## 2014-03-02 MED ORDER — ONDANSETRON HCL 4 MG PO TABS: 4.0000 mg | ORAL_TABLET | Freq: Four times a day (QID) | ORAL | Status: DC | PRN

## 2014-03-02 MED ORDER — HYDROMORPHONE HCL PF 1 MG/ML IJ SOLN
INTRAMUSCULAR | Status: AC
Start: 2014-03-02 — End: 2014-03-02
  Administered 2014-03-02: 1 mg
  Filled 2014-03-02: qty 1

## 2014-03-02 MED ORDER — ONDANSETRON HCL 4 MG/2ML IJ SOLN
INTRAMUSCULAR | Status: AC
  Filled 2014-03-02: qty 2

## 2014-03-02 MED ORDER — METOPROLOL SUCCINATE ER 25 MG PO TB24: 25.0000 mg | ORAL_TABLET | Freq: Every day | ORAL | Status: DC

## 2014-03-02 MED ORDER — POLYETHYLENE GLYCOL 3350 17 G PO PACK
17.0000 g | PACK | Freq: Every day | ORAL | Status: DC | PRN
Start: 2014-03-02 — End: 2014-03-02

## 2014-03-02 MED ORDER — HYDROMORPHONE HCL PF 1 MG/ML IJ SOLN
INTRAMUSCULAR | Status: AC
  Filled 2014-03-02: qty 1

## 2014-03-02 MED ORDER — OXYCODONE HCL 5 MG PO TABS
ORAL_TABLET | ORAL | Status: AC
Start: 2014-03-02 — End: 2014-03-02
  Filled 2014-03-02: qty 1

## 2014-03-02 MED ORDER — ONDANSETRON HCL 4 MG/2ML IJ SOLN: 4.0000 mg | Freq: Four times a day (QID) | INTRAMUSCULAR | Status: DC | PRN

## 2014-03-02 MED ORDER — FENTANYL CITRATE 0.05 MG/ML IJ SOLN
25.0000 ug | INTRAMUSCULAR | Status: DC | PRN
  Administered 2014-03-02 (×3): 50 ug via INTRAVENOUS

## 2014-03-02 MED ORDER — SORBITOL 70 % SOLN
30.0000 mL | Freq: Every day | Status: DC | PRN
Start: 2014-03-02 — End: 2014-03-02

## 2014-03-02 MED ORDER — LACTATED RINGERS IV SOLN
INTRAVENOUS | Status: DC | PRN
  Administered 2014-03-02: 16:00:00 via INTRAVENOUS

## 2014-03-02 MED ORDER — MORPHINE SULFATE 2 MG/ML IJ SOLN: 1.0000 mg | INTRAMUSCULAR | Status: DC | PRN

## 2014-03-02 SURGICAL SUPPLY — 62 items
BANDAGE CONFORM 3  STR LF (GAUZE/BANDAGES/DRESSINGS) IMPLANT
BANDAGE ELASTIC 3 VELCRO ST LF (GAUZE/BANDAGES/DRESSINGS) IMPLANT
BLADE SURG 10 STRL SS (BLADE) ×3 IMPLANT
BNDG COHESIVE 1X5 TAN STRL LF (GAUZE/BANDAGES/DRESSINGS) IMPLANT
BNDG COHESIVE 4X5 TAN STRL (GAUZE/BANDAGES/DRESSINGS) ×3 IMPLANT
BNDG COHESIVE 6X5 TAN STRL LF (GAUZE/BANDAGES/DRESSINGS) ×4 IMPLANT
BNDG GAUZE STRTCH 6 (GAUZE/BANDAGES/DRESSINGS) ×9 IMPLANT
CORDS BIPOLAR (ELECTRODE) IMPLANT
COVER SURGICAL LIGHT HANDLE (MISCELLANEOUS) ×3 IMPLANT
CUFF TOURNIQUET SINGLE 24IN (TOURNIQUET CUFF) IMPLANT
CUFF TOURNIQUET SINGLE 34IN LL (TOURNIQUET CUFF) ×6 IMPLANT
CUFF TOURNIQUET SINGLE 44IN (TOURNIQUET CUFF) IMPLANT
DRAPE EXTREMITY BILATERAL (DRAPE) IMPLANT
DRAPE IMP U-DRAPE 54X76 (DRAPES) IMPLANT
DRAPE INCISE IOBAN 66X45 STRL (DRAPES) ×12 IMPLANT
DRAPE SURG 17X23 STRL (DRAPES) IMPLANT
DRAPE U-SHAPE 47X51 STRL (DRAPES) ×3 IMPLANT
DURAPREP 26ML APPLICATOR (WOUND CARE) ×3 IMPLANT
ELECT CAUTERY BLADE 6.4 (BLADE) ×3 IMPLANT
ELECT REM PT RETURN 9FT ADLT (ELECTROSURGICAL)
ELECTRODE REM PT RTRN 9FT ADLT (ELECTROSURGICAL) IMPLANT
FACESHIELD WRAPAROUND (MASK) IMPLANT
FACESHIELD WRAPAROUND OR TEAM (MASK) IMPLANT
GAUZE XEROFORM 1X8 LF (GAUZE/BANDAGES/DRESSINGS) ×3 IMPLANT
GAUZE XEROFORM 5X9 LF (GAUZE/BANDAGES/DRESSINGS) ×3 IMPLANT
GLOVE SURG SS PI 7.5 STRL IVOR (GLOVE) ×6 IMPLANT
GOWN STRL REUS W/ TWL LRG LVL3 (GOWN DISPOSABLE) ×1 IMPLANT
GOWN STRL REUS W/ TWL XL LVL3 (GOWN DISPOSABLE) ×1 IMPLANT
GOWN STRL REUS W/TWL LRG LVL3 (GOWN DISPOSABLE) ×3
GOWN STRL REUS W/TWL XL LVL3 (GOWN DISPOSABLE) ×3
HANDPIECE INTERPULSE COAX TIP (DISPOSABLE)
KIT BASIN OR (CUSTOM PROCEDURE TRAY) ×3 IMPLANT
KIT ROOM TURNOVER OR (KITS) ×3 IMPLANT
MANIFOLD NEPTUNE II (INSTRUMENTS) ×3 IMPLANT
NS IRRIG 1000ML POUR BTL (IV SOLUTION) ×6 IMPLANT
PACK ORTHO EXTREMITY (CUSTOM PROCEDURE TRAY) ×3 IMPLANT
PAD ABD 8X10 STRL (GAUZE/BANDAGES/DRESSINGS) ×3 IMPLANT
PAD ARMBOARD 7.5X6 YLW CONV (MISCELLANEOUS) ×6 IMPLANT
PADDING CAST ABS 4INX4YD NS (CAST SUPPLIES) ×4
PADDING CAST ABS COTTON 4X4 ST (CAST SUPPLIES) ×2 IMPLANT
PADDING CAST COTTON 6X4 STRL (CAST SUPPLIES) ×3 IMPLANT
SET HNDPC FAN SPRY TIP SCT (DISPOSABLE) IMPLANT
SPONGE GAUZE 4X4 12PLY (GAUZE/BANDAGES/DRESSINGS) ×6 IMPLANT
SPONGE GAUZE 4X4 12PLY STER LF (GAUZE/BANDAGES/DRESSINGS) ×2 IMPLANT
SPONGE LAP 18X18 X RAY DECT (DISPOSABLE) ×6 IMPLANT
STOCKINETTE IMPERVIOUS 9X36 MD (GAUZE/BANDAGES/DRESSINGS) ×3 IMPLANT
SUT ETHILON 2 0 FS 18 (SUTURE) ×9 IMPLANT
SUT ETHILON 2 0 PSLX (SUTURE) ×3 IMPLANT
SUT ETHILON 3 0 PS 1 (SUTURE) ×6 IMPLANT
SUT VIC AB 2-0 CT1 36 (SUTURE) ×3 IMPLANT
SUT VIC AB 2-0 FS1 27 (SUTURE) ×6 IMPLANT
SYR CONTROL 10ML LL (SYRINGE) IMPLANT
TOWEL OR 17X24 6PK STRL BLUE (TOWEL DISPOSABLE) ×3 IMPLANT
TOWEL OR 17X26 10 PK STRL BLUE (TOWEL DISPOSABLE) ×3 IMPLANT
TUBE ANAEROBIC SPECIMEN COL (MISCELLANEOUS) IMPLANT
TUBE CONNECTING 12'X1/4 (SUCTIONS) ×1
TUBE CONNECTING 12X1/4 (SUCTIONS) ×2 IMPLANT
TUBE FEEDING 5FR 15 INCH (TUBING) IMPLANT
TUBING CYSTO DISP (UROLOGICAL SUPPLIES) ×5 IMPLANT
UNDERPAD 30X30 INCONTINENT (UNDERPADS AND DIAPERS) ×6 IMPLANT
WATER STERILE IRR 1000ML POUR (IV SOLUTION) ×3 IMPLANT
YANKAUER SUCT BULB TIP NO VENT (SUCTIONS) ×3 IMPLANT

## 2014-03-02 NOTE — H&P (Signed)
H&P update  The surgical history has been reviewed and remains accurate without interval change.  The patient was re-examined and patient's physiologic condition has not changed significantly in the last 30 days. The condition still exists that makes this procedure necessary. The treatment plan remains the same, without new options for care.  No new pharmacological allergies or types of therapy has been initiated that would change the plan or the appropriateness of the plan.  The patient and/or family understand the potential benefits and risks.  Mayra ReelN. Michael Xu, MD 03/02/2014 7:29 AM

## 2014-03-02 NOTE — Anesthesia Postprocedure Evaluation (Signed)
  Anesthesia Post-op Note  Patient: Jorge Long  Procedure(s) Performed: Procedure(s): Removal of Left foot mass (Left)  Patient Location: PACU  Anesthesia Type:General  Level of Consciousness: awake  Airway and Oxygen Therapy: Patient Spontanous Breathing  Post-op Pain: mild  Post-op Assessment: Post-op Vital signs reviewed  Post-op Vital Signs: Reviewed  Complications: No apparent anesthesia complications

## 2014-03-02 NOTE — Care Management Note (Signed)
03/02/14 2:46pm Vance PeperSusan Srikar Chiang, RN BSN Case Manager Patient will go to surgery this afternoon. Case manager spoke with patient and his daughter Orlie PollenLynne concerning plans at discharge. Patient and daughter state he will return home. Orlie PollenLynne and her husband Casimiro NeedleMichael wil assist in his care. [phone].If home health is needed they want to go with [facility]. CM will continue to monitor.

## 2014-03-02 NOTE — Transfer of Care (Signed)
Immediate Anesthesia Transfer of Care Note  Patient: Jorge DistanceFranklin Long  Procedure(s) Performed: Procedure(s): Removal of Left foot mass (Left)  Patient Location: PACU  Anesthesia Type:General  Level of Consciousness: awake, alert  and oriented  Airway & Oxygen Therapy: Patient Spontanous Breathing and Patient connected to nasal cannula oxygen  Post-op Assessment: Report given to PACU RN, Post -op Vital signs reviewed and stable and Patient moving all extremities X 4  Post vital signs: Reviewed and stable  Complications: No apparent anesthesia complications

## 2014-03-02 NOTE — Anesthesia Postprocedure Evaluation (Signed)
  Anesthesia Post-op Note  Patient: Jorge Long  Procedure(s) Performed: Procedure(s): Removal of Left foot mass (Left)  Patient Location: PACU  Anesthesia Type:General  Level of Consciousness: awake  Airway and Oxygen Therapy: Patient Spontanous Breathing  Post-op Pain: mild  Post-op Assessment: Post-op Vital signs reviewed  Post-op Vital Signs: Reviewed  Complications: No apparent anesthesia complications 

## 2014-03-02 NOTE — Addendum Note (Signed)
Addendum created 03/02/14 1835 by Jodell CiproSarah A Emmalise Huard, CRNA   Modules edited: Anesthesia Medication Administration

## 2014-03-02 NOTE — Op Note (Signed)
   Date of Surgery: 03/02/2014  INDICATIONS: Mr. Jorge Long is a 80 y.o.-year-old male who has had a left foot mass for over 1 year with frequent infections that have required drainage.  He's having trouble with shoewear also ;  The patient did consent to the procedure after discussion of the risks and benefits.  PREOPERATIVE DIAGNOSIS: left foot mass  POSTOPERATIVE DIAGNOSIS: Same.  PROCEDURE: excision of left foot mass  SURGEON: Jorge Long, M.D.  ASSIST: none.  ANESTHESIA:  general  IV FLUIDS AND URINE: See anesthesia.  ESTIMATED BLOOD LOSS: minimal mL.  IMPLANTS: none  COMPLICATIONS: None.  DESCRIPTION OF PROCEDURE: The patient was brought to the operating room and placed supine on the operating table.  The patient had been signed prior to the procedure and this was documented. The patient had the anesthesia placed by the anesthesiologist.  A time-out was performed to confirm that this was the correct patient, site, side and location. The patient had an SCD on the opposite lower extremity. The patient did receive antibiotics prior to the incision and was re-dosed during the procedure as needed at indicated intervals.  The patient had the operative extremity prepped and draped in the standard surgical fashion.    An Esmarch tourniquet was placed around his calf. I used a plantar incision in which I ellipsed out the draining site.  Once I encountered the mass there was a scant amount of clear serous drainage. The majority of the mass was necrotic appearing. There was no blood supply to this. This was sharply excised using a knife and rongeur. The mass was sent off to pathology for culture and Gram stain and identification. I then inspected the wound bed and there did not appear to be any residual necrotic tissue. The wound is thoroughly irrigated using 3 L of normal saline. The Esmarch tourniquet was then taken off. The tissues were all healthy viable bleeding tissue. The wound was then closed  loosely using 3-0 nylon suture. A sterile dressing was applied. The patient awoke from anesthesia uneventfully and was transferred to the PACU in stable condition.  POSTOPERATIVE PLAN: The patient will be nonweightbearing to the left foot until the incision heals. I plan on placing him on 2 more weeks of oral clindamycin. I plan on discharging him tomorrow after physical therapy.  Mayra ReelN. Michael Xu, MD [facility] [phone] 6:34 PM

## 2014-03-02 NOTE — Brief Op Note (Signed)
   Brief Op Note  Date of Surgery: 03/02/2014  Preoperative Diagnosis: left foot mass  Postoperative Diagnosis: same  Procedure: Procedure(s): Removal of Left foot mass  Implants: none  Surgeons: Surgeon(s): Tisheena Maguire Glee ArvinMichael Kimberly Coye, MD  Anesthesia: General  Drains: none  Estimated Blood Loss: See anesthesia record  Complications: None  Condition to PACU: Stable  Jorge Kester Glee ArvinMichael Janelis Stelzer, MD [facility] 03/02/2014 5:10 PM

## 2014-03-02 NOTE — Anesthesia Procedure Notes (Signed)
Procedure Name: LMA Insertion Date/Time: 03/02/2014 4:31 PM Performed by: Orvilla FusATO, Runell Kovich A Pre-anesthesia Checklist: Patient identified, Timeout performed, Emergency Drugs available, Suction available and Patient being monitored Patient Re-evaluated:Patient Re-evaluated prior to inductionOxygen Delivery Method: Circle system utilized Preoxygenation: Pre-oxygenation with 100% oxygen Intubation Type: IV induction Ventilation: Mask ventilation without difficulty LMA: LMA inserted LMA Size: 4.0 Number of attempts: 2 Placement Confirmation: positive ETCO2 and breath sounds checked- equal and bilateral Tube secured with: Tape Dental Injury: Teeth and Oropharynx as per pre-operative assessment

## 2014-03-02 NOTE — Anesthesia Preprocedure Evaluation (Addendum)
Anesthesia Evaluation  Patient identified by MRN, date of birth, ID band Patient awake    History of Anesthesia Complications Negative for: history of anesthetic complications  Airway Mallampati: II TM Distance: >3 FB Neck ROM: Full    Dental  (+) Upper Dentures, Dental Advisory Given, Teeth Intact   Pulmonary neg pulmonary ROS,          Cardiovascular hypertension, Pt. on home beta blockers     Neuro/Psych    GI/Hepatic negative GI ROS, Neg liver ROS,   Endo/Other  negative endocrine ROS  Renal/GU negative Renal ROS     Musculoskeletal   Abdominal   Peds  Hematology   Anesthesia Other Findings   Reproductive/Obstetrics                          Anesthesia Physical Anesthesia Plan  ASA: III  Anesthesia Plan: General   Post-op Pain Management:    Induction: Intravenous  Airway Management Planned: LMA  Additional Equipment:   Intra-op Plan:   Post-operative Plan: Extubation in OR  Informed Consent: I have reviewed the patients History and Physical, chart, labs and discussed the procedure including the risks, benefits and alternatives for the proposed anesthesia with the patient or authorized representative who has indicated his/her understanding and acceptance.     Plan Discussed with: CRNA and Anesthesiologist  Anesthesia Plan Comments:         Anesthesia Quick Evaluation

## 2014-03-03 MED ORDER — HYDROCODONE-ACETAMINOPHEN 5-325 MG PO TABS: 1.0000 | ORAL_TABLET | ORAL | Status: DC | PRN

## 2014-03-03 MED ORDER — CLINDAMYCIN HCL 150 MG PO CAPS: 450.0000 mg | ORAL_CAPSULE | Freq: Three times a day (TID) | ORAL | Status: AC

## 2014-03-03 NOTE — Progress Notes (Signed)
Dr Roda ShuttersXu phoned regarding frequent emesis, discharge cancelled. IVF restarted.

## 2014-03-03 NOTE — Progress Notes (Signed)
Subjective:  Patient denies complaints  Objective:   VITALS:   Filed Vitals:   03/02/14 1815 03/02/14 1957 03/03/14 0206 03/03/14 0514  BP: 153/79 153/86 142/78 124/84  Pulse: 65 76 81 77  Temp:  98.1 F (36.7 C) 98.5 F (36.9 C) 97.9 F (36.6 C)  TempSrc:  Oral Oral Oral  Resp: 22 20 18 18   Height:      Weight:      SpO2: 100% 100% 98% 98%    Dressing c/d/i Foot wwp   Lab Results  Component Value Date   WBC 8.7 02/26/2014   HGB 12.3* 02/26/2014   HCT 35.2* 02/26/2014   MCV 83.0 02/26/2014   PLT 179 02/26/2014     Assessment/Plan:  - Up with PT/OT - DVT ppx - SCDs, ambulation - NWB LLE - po clindamycin x 2 weeks - postop shoe - may dc home today after PT  Problem List Items Addressed This Visit     Other   *Mass of left foot - Primary   Relevant Orders      Call MD / Call 911      Diet - low sodium heart healthy      Constipation Prevention      Increase activity slowly as tolerated      Driving restrictions      TED hose      Do not put a pillow under the knee. Place it under the heel.      Follow the hip precautions as taught in Physical Therapy      TED hose      Do not sit on low chairs, stoools or toilet seats, as it may be difficult to get up from low surfaces      DO NOT drive, shower or take a tub bath until instructed by your physician      Discharge wound care:       Cheral AlmasXu, Naiping Michael 03/03/2014, 7:56 AM [phone]

## 2014-03-03 NOTE — Discharge Instructions (Signed)
1. Remove dressings on 03/04/14.  Put clean gauze on incision and wrap it up. 2. Strict non weight bearing to left foot until incision is healed.

## 2014-03-03 NOTE — Progress Notes (Signed)
Am meds help until pt no longer experiencing emesis or nausea,

## 2014-03-03 NOTE — Progress Notes (Signed)
Occupational Therapy Evaluation and Discharge Patient Details Name: Jorge DistanceFranklin Long MRN: [id number] DOB: 11/14/1933 Today's Date: 03/03/2014    History of Present Illness Pt 80 y.o Male s/p excision of left foot mass   Clinical Impression   PTA pt lived at home with his daughter and was Independent with ADLs and mobility. Education and training provided regarding compensatory techniques for ADLs and shower transfer. Per PT, pt doing well with mobility however becoming fatigued with Long. Pt has no further OT concerns and reports he will have assistance at home 24/7. Acute OT to sign off.     Follow Up Recommendations  No OT follow up;Supervision/Assistance - 24 hour    Equipment Recommendations  None recommended by OT       Precautions / Restrictions Restrictions Weight Bearing Restrictions: Yes LLE Weight Bearing: Non weight bearing      Mobility Transfers Overall transfer level: Needs assistance Equipment used: Rolling walker (2 wheeled) Transfers: Sit to/from Stand Sit to Stand: Supervision         General transfer comment: Supervision with sit<>stand from lowest bed setting similar to couch at home; Verbal cues for hand placement, able to push off from both hands on bed while safely following NWB status on LLE.         ADL Overall ADL's : Needs assistance/impaired Eating/Feeding: Independent;Sitting   Grooming: Set up;Sitting   Upper Body Bathing: Set up;Sitting   Lower Body Bathing: Min guard;Sit to/from stand   Upper Body Dressing : Set up;Sitting   Lower Body Dressing: Min guard;Sit to/from stand   Toilet Transfer: Ambulation;Supervision/safety;RW           Functional mobility during ADLs: Supervision/safety;Rolling walker                 Pertinent Vitals/Pain Pt c/o pain in L foot, does not report pain level. Showed pt how to call RN to request pain medication.     Hand Dominance Right   Extremity/Trunk Assessment Upper Extremity  Assessment Upper Extremity Assessment: Overall WFL for tasks assessed (R FF MMT of 5-/5)   Lower Extremity Assessment Lower Extremity Assessment: Defer to PT evaluation LLE: Unable to fully assess due to immobilization (NWB LLE) LLE Sensation:  (normal)       Communication Communication Communication: No difficulties   Cognition Arousal/Alertness: Awake/alert Behavior During Therapy: WFL for tasks assessed/performed Overall Cognitive Status: Within Functional Limits for tasks assessed                                Home Living Family/patient expects to be discharged to:: Private residence Living Arrangements: Children Available Help at Discharge: Family;Available 24 hours/day Type of Home: House Home Access: Level entry     Home Layout: Two level;Able to live on main level with bedroom/bathroom     Bathroom Shower/Tub: Walk-in Pensions consultantshower;Curtain   Bathroom Toilet: Standard     Home Equipment: Walker - standard;Shower seat          Prior Functioning/Environment Level of Independence: Independent                                       End of Session Equipment Utilized During Treatment: Rolling walker  Activity Tolerance: Patient tolerated treatment well Patient left: in chair;with call bell/phone within reach   Time: 1011-1020 OT Time Calculation (min): 9 min Charges:  OT General Charges $OT Visit: 1 Procedure OT Evaluation $Initial OT Evaluation Tier I: 1 Procedure  Rae Lips [phone] 03/03/2014, 10:24 AM

## 2014-03-03 NOTE — Evaluation (Signed)
Physical Therapy Evaluation and Discharge Patient Details Name: Jorge DistanceFranklin Long MRN: [id number] DOB: 06/09/1933 Today's Date: 03/03/2014   History of Present Illness  excision of left foot mass  Clinical Impression  Patient evaluated by Physical Therapy with no further acute PT needs identified. All education has been completed and the patient has no further questions. He is ambulating at distances he will need in order to safely get into house. Pt is compliant with NWB status on LLE and verbalizes understanding of all pertinent education. See below for any follow-up Physial Therapy or equipment needs. PT is signing off. Thank you for this referral.     Follow Up Recommendations No PT follow up    Equipment Recommendations  3in1 (PT)    Recommendations for Other Services       Precautions / Restrictions Restrictions Weight Bearing Restrictions: Yes LLE Weight Bearing: Non weight bearing      Mobility  Bed Mobility Overal bed mobility: Modified Independent             General bed mobility comments: Mod I with bed mobility, does not require any physical assist for verbal cues  Transfers Overall transfer level: Needs assistance Equipment used: Rolling walker (2 wheeled) Transfers: Sit to/from Stand Sit to Stand: Supervision         General transfer comment: Supervision with sit<>stand from lowest bed setting similar to couch at home; Verbal cues for hand placement, able to push off from both hands on bed while safely following NWB status on LLE.  Ambulation/Gait Ambulation/Gait assistance: Supervision Ambulation Long (Feet): 40 Feet Assistive device: Rolling walker (2 wheeled) Gait Pattern/deviations:  ("hop-to" pattern)     General Gait Details: Supervision for safety with ambulation. Pt shows good control of RW and only complains of RLE fatigue after ambulating 40 feet. Cues provided for RW placement and to use UEs to decrease load on RLE. Pt compliant with NWB  status during ambulation  Stairs            Wheelchair Mobility    Modified Rankin (Stroke Patients Only)       Balance Overall balance assessment: Needs assistance Sitting-balance support: No upper extremity supported Sitting balance-Leahy Scale: Good Sitting balance - Comments: Pt able to reach outer limits of stability while sitting EOB without UE support   Standing balance support: Bilateral upper extremity supported Standing balance-Leahy Scale: Poor Standing balance comment: RW required to stand safely                             Pertinent Vitals/Pain Pain 0/10 Pt stated he vomited recently but feels better now Pt repositioned in chair for comfort.    Home Living Family/patient expects to be discharged to:: Private residence Living Arrangements: Children Available Help at Discharge: Family;Available 24 hours/day Type of Home: House Home Access: Level entry     Home Layout: Two level;Able to live on main level with bedroom/bathroom Home Equipment: Walker - standard;Shower seat      Prior Function Level of Independence: Independent               Hand Dominance   Dominant Hand: Right    Extremity/Trunk Assessment   Upper Extremity Assessment: Overall WFL for tasks assessed           Lower Extremity Assessment: LLE deficits/detail         Communication   Communication: No difficulties  Cognition Arousal/Alertness: Awake/alert Behavior During Therapy: WFL for  tasks assessed/performed Overall Cognitive Status: Within Functional Limits for tasks assessed                      General Comments General comments (skin integrity, edema, etc.): Good sensation and movement of Left foot and toes. Pt educated on elevating LLE for swelling and indications of worsening symptoms, to call MD.    Exercises General Exercises - Lower Extremity Ankle Circles/Pumps: AROM;Left;10 reps;Seated      Assessment/Plan    PT  Assessment Patent does not need any further PT services  PT Diagnosis     PT Problem List    PT Treatment Interventions     PT Goals (Current goals can be found in the Care Plan section) Acute Rehab PT Goals Patient Stated Goal: Go home PT Goal Formulation: No goals set, d/c therapy    Frequency     Barriers to discharge        Co-evaluation               End of Session Equipment Utilized During Treatment: Gait belt Activity Tolerance: Patient tolerated treatment well Patient left: in chair;with call bell/phone within reach Nurse Communication: Mobility status         Time: 1610-9604 PT Time Calculation (min): 23 min   Charges:   PT Evaluation $Initial PT Evaluation Tier I: 1 Procedure PT Treatments $Gait Training: 8-22 mins   PT G Codes:         Charlsie Merles,  [phone]  Berton Mount 03/03/2014, 10:01 AM

## 2014-03-03 NOTE — Progress Notes (Signed)
New iv site to rfa, iv analgesic and iv antiemetic given per new iv site'

## 2014-03-03 NOTE — Progress Notes (Signed)
Orthopedic Tech Progress Note Patient Details:  Jorge Long Prosise 09/01/1933 [id number] Post op shoe applied to Left LE. Application tolerated well. Ortho Devices Type of Ortho Device: Postop shoe/boot Ortho Device/Splint Location: Left LE Ortho Device/Splint Interventions: Application   Asia R Thompson 03/03/2014, 12:40 PM

## 2014-03-03 NOTE — Progress Notes (Signed)
Pt has lg amt of emesis, no other s/s of infection noted. 2 vicodin were given at 0845. Unsure of amount absorbed.

## 2014-03-04 ENCOUNTER — Encounter (HOSPITAL_COMMUNITY): Payer: Self-pay | Admitting: Orthopaedic Surgery

## 2014-03-04 NOTE — Progress Notes (Signed)
Subjective:  Patient n/v resolved.    Objective:   VITALS:   Filed Vitals:   03/03/14 1318 03/03/14 1458 03/03/14 2018 03/04/14 0559  BP: 152/73 124/62 138/75 149/78  Pulse: 58 64 62 64  Temp: 97.3 F (36.3 C) 97.7 F (36.5 C) 97.8 F (36.6 C) 98.1 F (36.7 C)  TempSrc: Oral Oral Oral Oral  Resp: 20 18 20 18   Height:      Weight:      SpO2: 98% 98% 98% 100%    Dressing c/d/i Foot wwp   Lab Results  Component Value Date   WBC 8.7 02/26/2014   HGB 12.3* 02/26/2014   HCT 35.2* 02/26/2014   MCV 83.0 02/26/2014   PLT 179 02/26/2014     Assessment/Plan:  - cleared PT/OT - DVT ppx - SCDs, ambulation - NWB LLE - po clindamycin x 2 weeks - postop shoe - dc home today - f/u 2 weeks - rx in chart  Problem List Items Addressed This Visit     Other   *Mass of left foot - Primary   Relevant Orders      Call MD / Call 911      Diet - low sodium heart healthy      Constipation Prevention      Increase activity slowly as tolerated      Driving restrictions      TED hose      Do not put a pillow under the knee. Place it under the heel.      Follow the hip precautions as taught in Physical Therapy      TED hose      Do not sit on low chairs, stoools or toilet seats, as it may be difficult to get up from low surfaces      DO NOT drive, shower or take a tub bath until instructed by your physician      Discharge wound care:      Call MD / Call 911      Diet - low sodium heart healthy      Constipation Prevention      Increase activity slowly as tolerated      Driving restrictions      TED hose      Do not put a pillow under the knee. Place it under the heel.      Follow the hip precautions as taught in Physical Therapy      TED hose      Do not sit on low chairs, stoools or toilet seats, as it may be difficult to get up from low surfaces      DO NOT drive, shower or take a tub bath until instructed by your physician      Discharge wound care:       Cheral Almasaiping Michael  Barney Russomanno 03/04/2014, 7:41 AM [phone]

## 2014-03-04 NOTE — Discharge Summary (Signed)
Physician Discharge Summary      Patient ID: Jorge Long MRN: [id number] DOB/AGE: 78-Nov-1934 78 y.o.  Admit date: 02/26/2014 Discharge date: 03/04/2014  Admission Diagnoses:  Mass of left foot  Discharge Diagnoses:  Principal Problem:   Mass of left foot   Past Medical History  Diagnosis Date  . Hypertension     Surgeries: Procedure(s): Removal of Left foot mass on 02/26/2014 - 03/02/2014   Consultants (if any):    Discharged Condition: Improved  Hospital Course: Jorge Long is an 80 y.o. male who was admitted 02/26/2014 with a diagnosis of Mass of left foot and went to the operating room on 02/26/2014 - 03/02/2014 and underwent the above named procedures.    He was given perioperative antibiotics:      Anti-infectives   Start     Dose/Rate Route Frequency Ordered Stop   03/03/14 0000  clindamycin (CLEOCIN) 150 MG capsule     450 mg Oral Every 8 hours 03/03/14 0744 03/17/14 2359   03/02/14 2200  clindamycin (CLEOCIN) capsule 600 mg  Status:  Discontinued     600 mg Oral 3 times per day 03/02/14 1829 03/04/14 1559   03/02/14 0600  clindamycin (CLEOCIN) IVPB 900 mg  Status:  Discontinued     900 mg 100 mL/hr over 30 Minutes Intravenous On call to O.R. 03/01/14 1821 03/02/14 1829   03/02/14 0600  clindamycin (CLEOCIN) IVPB 900 mg  Status:  Discontinued     900 mg 100 mL/hr over 30 Minutes Intravenous On call to O.R. 03/01/14 1755 03/02/14 1829   02/28/14 1000  [MAR Hold]  clindamycin (CLEOCIN) IVPB 600 mg  Status:  Discontinued     (On MAR Hold since 03/02/14 1522)   600 mg 100 mL/hr over 30 Minutes Intravenous Every 8 hours 02/28/14 0900 03/02/14 1829   02/27/14 1700  vancomycin (VANCOCIN) 1,250 mg in sodium chloride 0.9 % 250 mL IVPB  Status:  Discontinued     1,250 mg 166.7 mL/hr over 90 Minutes Intravenous Every 24 hours 02/27/14 0751 03/01/14 1647   02/26/14 1700  vancomycin (VANCOCIN) 1,250 mg in sodium chloride 0.9 % 250 mL IVPB     1,250 mg 166.7 mL/hr over 90  Minutes Intravenous  Once 02/26/14 1617 02/26/14 1831    .  He was given sequential compression devices, early ambulation for DVT prophylaxis.  He benefited maximally from the hospital stay and there were no complications.  The final pathology of the mass was consistent with necrotic tissue.  Recent vital signs:  Filed Vitals:   03/04/14 0559  BP: 149/78  Pulse: 64  Temp: 98.1 F (36.7 C)  Resp: 18    Recent laboratory studies:  Lab Results  Component Value Date   HGB 12.3* 02/26/2014   Lab Results  Component Value Date   WBC 8.7 02/26/2014   PLT 179 02/26/2014   No results found for this basename: INR   Lab Results  Component Value Date   NA 136* 03/01/2014   K 4.2 03/01/2014   CL 101 03/01/2014   CO2 23 03/01/2014   BUN 17 03/01/2014   CREATININE 0.82 03/01/2014   GLUCOSE 96 03/01/2014    Discharge Medications:     Medication List    STOP taking these medications       amoxicillin-clavulanate 875-125 MG per tablet  Commonly known as:  AUGMENTIN      TAKE these medications       amLODipine 5 MG tablet  Commonly known as:  NORVASC  Take 5 mg by mouth daily.     clindamycin 150 MG capsule  Commonly known as:  CLEOCIN  Take 3 capsules (450 mg total) by mouth every 8 (eight) hours.     dutasteride 0.5 MG capsule  Commonly known as:  AVODART  Take 0.5 mg by mouth daily.     finasteride 5 MG tablet  Commonly known as:  PROSCAR  Take 5 mg by mouth daily.     HYDROcodone-acetaminophen 5-325 MG per tablet  Commonly known as:  NORCO/VICODIN  Take 1 tablet by mouth every 4 (four) hours as needed.     lisinopril 20 MG tablet  Commonly known as:  PRINIVIL,ZESTRIL  Take 20 mg by mouth daily.     metoprolol succinate 25 MG 24 hr tablet  Commonly known as:  TOPROL-XL  Take 25 mg by mouth daily.     ranitidine 300 MG tablet  Commonly known as:  ZANTAC  Take 300 mg by mouth at bedtime.     travoprost (benzalkonium) 0.004 % ophthalmic solution  Commonly known as:   TRAVATAN  1 drop at bedtime.        Diagnostic Studies: Mr Foot Left W Wo Contrast  02/27/2014   CLINICAL DATA:  Left foot mass for year. Multiple drainages. Recurrent mass with drainage. No recent trauma.  EXAM: MRI OF THE LEFT FOREFOOT WITHOUT AND WITH CONTRAST  TECHNIQUE: Multiplanar, multisequence MR imaging was performed both before and after administration of intravenous contrast.  CONTRAST:  13mL MULTIHANCE GADOBENATE DIMEGLUMINE 529 MG/ML IV SOLN  COMPARISON:  None.  FINDINGS: There is diffuse subcutaneous edema in the distal leg and foot. There is a soft tissue mass lateral to the fifth metatarsal base. On post gadolinium imaging, this shows peripheral enhancement with a large area of nonenhancing tissue along the inferior margin, compatible with necrotic soft tissue. This likely represents an area of ulceration and abscess with necrosis of soft tissue. On the T2 weighted imaging, heterogeneous fluid is present centrally, measuring 18 mm plantar to dorsal by 21 mm, 8 mm transverse and 26 mm in the long axis of the foot.  IMPRESSION: 21 mm x 8 mm x 26 mm necrotic mass is present lateral to the fifth metatarsal base. This is most compatible with a small abscess, with necrotic subcutaneous tissues surrounding the abscess. No osteomyelitis.   Electronically Signed   By: Andreas NewportGeoffrey  Lamke M.D.   On: 02/27/2014 15:03    Disposition: 01-Home or Self Care  Discharge Orders   Future Orders Complete By Expires   Call MD / Call 911  As directed    Call MD / Call 911  As directed    Constipation Prevention  As directed    Constipation Prevention  As directed    Diet - low sodium heart healthy  As directed    Diet - low sodium heart healthy  As directed    Discharge wound care:  As directed    Discharge wound care:  As directed    DO NOT drive, shower or take a tub bath until instructed by your physician  As directed    DO NOT drive, shower or take a tub bath until instructed by your physician  As  directed    Do not put a pillow under the knee. Place it under the heel.  As directed    Do not put a pillow under the knee. Place it under the heel.  As directed    Do not  sit on low chairs, stoools or toilet seats, as it may be difficult to get up from low surfaces  As directed    Do not sit on low chairs, stoools or toilet seats, as it may be difficult to get up from low surfaces  As directed    Driving restrictions  As directed    Driving restrictions  As directed    Follow the hip precautions as taught in Physical Therapy  As directed    Follow the hip precautions as taught in Physical Therapy  As directed    Increase activity slowly as tolerated  As directed    Increase activity slowly as tolerated  As directed    TED hose  As directed    TED hose  As directed    TED hose  As directed    TED hose  As directed       Follow-up Information   Follow up with Cheral Almas, MD In 2 weeks.   Specialty:  Orthopedic Surgery   Contact information:   [address] [place] [address] [phone]        Signed: Cheral Almas 03/04/2014, 9:30 PM

## 2014-03-05 NOTE — Accreditation Note (Signed)
CARE MANAGEMENT NOTE 03/05/2014  Patient:  Jorge Long,Jorge Long   Account Number:  [id number]  Date Initiated:  02/27/2014  Documentation initiated by:  Vance PeperBRADY,Ming Kunka  Subjective/Objective Assessment:   80 yr old male admitted with left foot mass     Action/Plan:   CM will continue to monitor.  Daughter would like HHRN to check her dad's foot. They have all DME.   Anticipated DC Date:  03/04/2014   Anticipated DC Plan:  HOME W HOME HEALTH SERVICES      DC Planning Services  CM consult      [facility]   Choice offered to / List presented to:  C-4 Adult Children        HH arranged  HH-1 RN      [facility]   Status of service:  Completed, signed off

## 2014-03-06 LAB — TISSUE CULTURE: CULTURE: NO GROWTH

## 2014-03-07 LAB — ANAEROBIC CULTURE

## 2014-03-10 ENCOUNTER — Ambulatory Visit: Payer: Medicare Other | Admitting: Podiatry

## 2015-07-08 ENCOUNTER — Encounter: Payer: Self-pay | Admitting: Cardiovascular Disease

## 2016-07-01 ENCOUNTER — Ambulatory Visit (INDEPENDENT_AMBULATORY_CARE_PROVIDER_SITE_OTHER): Payer: Medicare Other

## 2016-07-01 ENCOUNTER — Ambulatory Visit (HOSPITAL_COMMUNITY): Payer: Medicare Other

## 2016-07-01 ENCOUNTER — Ambulatory Visit (HOSPITAL_COMMUNITY)
Admission: EM | Admit: 2016-07-01 | Discharge: 2016-07-01 | Disposition: A | Discharge status: Home / Self Care | Payer: Medicare Other | Attending: Emergency Medicine | Admitting: Emergency Medicine

## 2016-07-01 ENCOUNTER — Encounter (HOSPITAL_COMMUNITY): Payer: Self-pay | Admitting: Emergency Medicine

## 2016-07-01 DIAGNOSIS — S51812A Laceration without foreign body of left forearm, initial encounter: Secondary | ICD-10-CM | POA: Diagnosis not present

## 2016-07-01 DIAGNOSIS — T148 Other injury of unspecified body region: Secondary | ICD-10-CM | POA: Diagnosis not present

## 2016-07-01 DIAGNOSIS — T148XXA Other injury of unspecified body region, initial encounter: Secondary | ICD-10-CM

## 2016-07-01 NOTE — ED Triage Notes (Signed)
The patient presented to the [facility] with a complaint of left arm pain secondary to a fall that occurred today.

## 2016-07-01 NOTE — ED Provider Notes (Signed)
CSN: [id number]     Arrival date & time 07/01/16  1702 History   First MD Initiated Contact with Patient 07/01/16 1822     Chief Complaint  Patient presents with  . Fall   (Consider location/radiation/quality/duration/timing/severity/associated sxs/prior Treatment) HPI Patient is an 82 year old male who states he was at church when he turned around to throw something in the trash and tripped over an elevation of the sidewalk. He came down on the his left arm. Sustained a skin tear bleeding was controlled with pressure and patient came to the urgent care for evaluation and treatment. He also states that his daughter who is a nurse is concerned about the swelling just below the elbow and had requested an x-ray. Past Medical History:  Diagnosis Date  . Hypertension    Past Surgical History:  Procedure Laterality Date  . I&D EXTREMITY Left 03/02/2014   Procedure: Removal of Left foot mass;  Surgeon: Cheral Almas, MD;  Location: [facility];  Service: Orthopedics;  Laterality: Left;   History reviewed. No pertinent family history. Social History  Substance Use Topics  . Smoking status: Never Smoker  . Smokeless tobacco: Never Used  . Alcohol use No    Review of Systems  Denies: HEADACHE, NAUSEA, ABDOMINAL PAIN, CHEST PAIN, CONGESTION, DYSURIA, SHORTNESS OF BREATH  Allergies  Review of patient's allergies indicates no known allergies.  Home Medications   Prior to Admission medications   Medication Sig Start Date End Date Taking? Authorizing Provider  amLODipine (NORVASC) 5 MG tablet Take 5 mg by mouth daily.   Yes Historical Provider, MD  dutasteride (AVODART) 0.5 MG capsule Take 0.5 mg by mouth daily.   Yes Historical Provider, MD  finasteride (PROSCAR) 5 MG tablet Take 5 mg by mouth daily.   Yes Historical Provider, MD  lisinopril (PRINIVIL,ZESTRIL) 20 MG tablet Take 20 mg by mouth daily.   Yes Historical Provider, MD  metoprolol succinate (TOPROL-XL) 25 MG 24 hr tablet Take 25 mg  by mouth daily.   Yes Historical Provider, MD  ranitidine (ZANTAC) 300 MG tablet Take 300 mg by mouth at bedtime.   Yes Historical Provider, MD  travoprost, benzalkonium, (TRAVATAN) 0.004 % ophthalmic solution 1 drop at bedtime.   Yes Historical Provider, MD  HYDROcodone-acetaminophen (NORCO/VICODIN) 5-325 MG per tablet Take 1 tablet by mouth every 4 (four) hours as needed. 03/03/14   Naiping Donnelly Stager, MD   Meds Ordered and Administered this Visit  Medications - No data to display  BP 166/76 (BP Location: Right Arm)   Pulse 72   Temp 98.3 F (36.8 C) (Oral)   Resp 18   SpO2 99%  No data found.   Physical Exam NURSES NOTES AND VITAL SIGNS REVIEWED. CONSTITUTIONAL: Well developed, well nourished, no acute distress HEENT: normocephalic, atraumatic EYES: Conjunctiva normal NECK:normal ROM, supple, no adenopathy PULMONARY:No respiratory distress, normal effort ABDOMINAL: Soft, ND, NT BS+, No CVAT MUSCULOSKELETAL: Normal ROM of all extremities, left forearm there is a 10-12 cm superficial skin tear noted. No tenderness no active bleeding. There is a contusion noted just below the elbow. The motion of the elbow tenderness. No radial head tenderness.  SKIN: warm and dry without rash PSYCHIATRIC: Mood and affect, behavior are normal  Urgent Care Course   Clinical Course    .Marland KitchenLaceration Repair Date/Time: 07/01/2016 8:37 PM Performed by: Tharon Aquas Authorized by: Charm Rings   Consent:    Consent obtained:  Verbal   Consent given by:  Patient Anesthesia (see MAR  for exact dosages):    Anesthesia method:  None Laceration details:    Location:  Shoulder/arm   Shoulder/arm location:  L lower arm   Length (cm):  10 Repair type:    Repair type:  Simple Exploration:    Hemostasis achieved with:  Direct pressure Treatment:    Area cleansed with:  Shur-Clens   Amount of cleaning:  Standard   Visualized foreign bodies/material removed: no   Skin repair:    Repair method:   Steri-Strips and tissue adhesive Approximation:    Approximation:  Loose   Vermilion border: well-aligned   Post-procedure details:    Dressing:  Non-adherent dressing   Patient tolerance of procedure:  Tolerated well, no immediate complications   (including critical care time)  Labs Review Labs Reviewed - No data to display  Imaging Review Dg Forearm Left  Result Date: 07/01/2016 CLINICAL DATA:  Pain after fall.  Laceration. EXAM: LEFT FOREARM - 2 VIEW COMPARISON:  None. FINDINGS: Soft tissue swelling over the dorsum of the forearm. No underlying fracture or foreign body identified. IMPRESSION: No fracture or foreign body. Electronically Signed   By: Gerome Sam III M.D   On: 07/01/2016 19:02     Visual Acuity Review  Right Eye Distance:   Left Eye Distance:   Bilateral Distance:    Right Eye Near:   Left Eye Near:    Bilateral Near:        10 cm skin tear left forearm repaired with wound adhesive and Steri-Strips. X-ray negative tetanus is up-to-date patient states he had one just recently. MDM   1. Contusion   2. Skin tear of forearm without complication, left, initial encounter     Patient is reassured that there are no issues that require transfer to higher level of care at this time or additional tests. Patient is advised to continue home symptomatic treatment. Patient is advised that if there are new or worsening symptoms to attend the emergency department, contact primary care provider, or return to UC. Instructions of care provided discharged home in stable condition.    THIS NOTE WAS GENERATED USING A VOICE RECOGNITION SOFTWARE PROGRAM. ALL REASONABLE EFFORTS  WERE MADE TO PROOFREAD THIS DOCUMENT FOR ACCURACY.  I have verbally reviewed the discharge instructions with the patient. A printed AVS was given to the patient.  All questions were answered prior to discharge.      Tharon Aquas, PA 07/01/16 2037    Tharon Aquas, [place] 07/01/16 2039

## 2017-03-06 ENCOUNTER — Ambulatory Visit (INDEPENDENT_AMBULATORY_CARE_PROVIDER_SITE_OTHER): Payer: Medicare Other | Admitting: Orthopaedic Surgery

## 2017-03-06 DIAGNOSIS — L84 Corns and callosities: Secondary | ICD-10-CM | POA: Insufficient documentation

## 2017-03-06 DIAGNOSIS — R2242 Localized swelling, mass and lump, left lower limb: Secondary | ICD-10-CM

## 2017-03-06 NOTE — Progress Notes (Signed)
   Office Visit Note   Patient: Jorge Long           Date of Birth: 07/20/1933           MRN: [id number] Visit Date: 03/06/2017              Requested by: Elias Else, MD [phone] [place], [place] [address] PCP: Lolita Patella, MD   Assessment & Plan: Visit Diagnoses:  1. Mass of left foot     Plan: Patient has custom orthotics and lateral posting of the fifth shoe. He needs to limit excessive walking for now I think this should heal just fine. I will OK with him doing Epsom salt soaks.  Follow-Up Instructions: Return if symptoms worsen or fail to improve.   Orders:  No orders of the defined types were placed in this encounter.  No orders of the defined types were placed in this encounter.     Procedures: No procedures performed   Clinical Data: No additional findings.   Subjective: Chief Complaint  Patient presents with  . Left Foot - Pain    Jorge Long is very well-known to me. I operated on his left foot. Years ago. He is got a very prominent base of the fifth metatarsal that gets irritated has a corn over it with prolonged walking and activity. He recently did a lot of walking and he comes in with a blister over this area. He denies any constitutional symptoms or drainage or redness or fevers.    Review of Systems  Constitutional: Negative.   All other systems reviewed and are negative.    Objective: Vital Signs: There were no vitals taken for this visit.  Physical Exam  Constitutional: He is oriented to person, place, and time. He appears well-developed and well-nourished.  HENT:  Head: Normocephalic and atraumatic.  Eyes: Pupils are equal, round, and reactive to light.  Neck: Neck supple.  Pulmonary/Chest: Effort normal.  Abdominal: Soft.  Musculoskeletal: Normal range of motion.  Neurological: He is alert and oriented to person, place, and time.  Skin: Skin is warm.  Psychiatric: He has a normal mood and affect. His  behavior is normal. Judgment and thought content normal.  Nursing note and vitals reviewed.   Ortho Exam Left foot exam shows no signs of infection. He is a plantar corn. He has coagulated blood from a superficial blister. There is no evidence of deeper penetration. There is no cellulitis or fluctuance. Specialty Comments:  No specialty comments available.  Imaging: No results found.   PMFS History: Patient Active Problem List   Diagnosis Date Noted  . Mass of left foot 02/26/2014   Past Medical History:  Diagnosis Date  . Hypertension     No family history on file.  Past Surgical History:  Procedure Laterality Date  . I&D EXTREMITY Left 03/02/2014   Procedure: Removal of Left foot mass;  Surgeon: Cheral Almas, MD;  Location: [facility] OR;  Service: Orthopedics;  Laterality: Left;   Social History   Occupational History  . Not on file.   Social History Main Topics  . Smoking status: Never Smoker  . Smokeless tobacco: Never Used  . Alcohol use No  . Drug use: No  . Sexual activity: Not on file

## 2017-03-07 ENCOUNTER — Telehealth (INDEPENDENT_AMBULATORY_CARE_PROVIDER_SITE_OTHER): Payer: Self-pay | Admitting: *Deleted

## 2017-03-07 NOTE — Telephone Encounter (Signed)
Patient called in this morning in regards to needing a prescription for an insert for his left foot? He has gotten one before from [employer] but he know needs a new one for his new shoes? Thank you his CB # [phone]) [phone].

## 2017-03-08 NOTE — Telephone Encounter (Signed)
Called pt to let him know order is ready for pick up at the front desk. Pt aware

## 2017-03-08 NOTE — Telephone Encounter (Signed)
Order made

## 2017-03-08 NOTE — Telephone Encounter (Signed)
Please advise 

## 2017-03-08 NOTE — Telephone Encounter (Signed)
Yes please

## 2017-03-30 ENCOUNTER — Other Ambulatory Visit: Payer: Self-pay | Admitting: Family Medicine

## 2017-03-30 DIAGNOSIS — I77810 Thoracic aortic ectasia: Secondary | ICD-10-CM

## 2017-04-02 ENCOUNTER — Other Ambulatory Visit: Payer: Self-pay | Admitting: Family Medicine

## 2017-04-02 DIAGNOSIS — I350 Nonrheumatic aortic (valve) stenosis: Secondary | ICD-10-CM

## 2017-04-03 ENCOUNTER — Other Ambulatory Visit: Payer: Self-pay

## 2017-04-03 ENCOUNTER — Ambulatory Visit (HOSPITAL_COMMUNITY): Payer: Medicare Other | Attending: Cardiovascular Disease

## 2017-04-03 DIAGNOSIS — I350 Nonrheumatic aortic (valve) stenosis: Secondary | ICD-10-CM

## 2017-04-03 DIAGNOSIS — I10 Essential (primary) hypertension: Secondary | ICD-10-CM | POA: Insufficient documentation

## 2017-04-03 DIAGNOSIS — R931 Abnormal findings on diagnostic imaging of heart and coronary circulation: Secondary | ICD-10-CM

## 2017-04-03 DIAGNOSIS — I080 Rheumatic disorders of both mitral and aortic valves: Secondary | ICD-10-CM | POA: Diagnosis not present

## 2017-04-03 HISTORY — DX: Abnormal findings on diagnostic imaging of heart and coronary circulation: R93.1

## 2017-04-12 ENCOUNTER — Ambulatory Visit
Admission: RE | Admit: 2017-04-12 | Discharge: 2017-04-12 | Disposition: A | Discharge status: Home / Self Care | Payer: Medicare Other | Source: Ambulatory Visit | Attending: Family Medicine | Admitting: Family Medicine

## 2017-04-12 DIAGNOSIS — I77810 Thoracic aortic ectasia: Secondary | ICD-10-CM

## 2017-04-12 MED ORDER — IOPAMIDOL (ISOVUE-300) INJECTION 61%
75.0000 mL | Freq: Once | INTRAVENOUS | Status: AC | PRN
  Administered 2017-04-12: 75 mL via INTRAVENOUS

## 2017-06-08 ENCOUNTER — Encounter: Payer: Medicare Other | Admitting: Cardiothoracic Surgery

## 2017-06-20 ENCOUNTER — Encounter: Payer: Medicare Other | Admitting: Cardiothoracic Surgery

## 2017-06-20 ENCOUNTER — Encounter: Payer: Self-pay | Admitting: *Deleted

## 2017-06-20 DIAGNOSIS — N400 Enlarged prostate without lower urinary tract symptoms: Secondary | ICD-10-CM | POA: Insufficient documentation

## 2017-06-20 DIAGNOSIS — E7800 Pure hypercholesterolemia, unspecified: Secondary | ICD-10-CM | POA: Insufficient documentation

## 2017-06-20 DIAGNOSIS — I10 Essential (primary) hypertension: Secondary | ICD-10-CM

## 2017-06-20 DIAGNOSIS — H409 Unspecified glaucoma: Secondary | ICD-10-CM | POA: Insufficient documentation

## 2017-06-20 DIAGNOSIS — K219 Gastro-esophageal reflux disease without esophagitis: Secondary | ICD-10-CM | POA: Insufficient documentation

## 2017-06-20 DIAGNOSIS — I712 Thoracic aortic aneurysm, without rupture, unspecified: Secondary | ICD-10-CM | POA: Insufficient documentation

## 2017-06-20 DIAGNOSIS — I350 Nonrheumatic aortic (valve) stenosis: Secondary | ICD-10-CM | POA: Insufficient documentation

## 2017-06-20 DIAGNOSIS — R931 Abnormal findings on diagnostic imaging of heart and coronary circulation: Secondary | ICD-10-CM

## 2017-06-21 ENCOUNTER — Encounter: Payer: Medicare Other | Admitting: Cardiothoracic Surgery

## 2017-06-22 ENCOUNTER — Encounter: Payer: Self-pay | Admitting: Cardiothoracic Surgery

## 2017-06-22 ENCOUNTER — Institutional Professional Consult (permissible substitution) (INDEPENDENT_AMBULATORY_CARE_PROVIDER_SITE_OTHER): Payer: Medicare Other | Admitting: Cardiothoracic Surgery

## 2017-06-22 VITALS — BP 160/70 | HR 57 | Resp 20 | Ht 64.0 in | Wt 142.0 lb

## 2017-06-22 DIAGNOSIS — I350 Nonrheumatic aortic (valve) stenosis: Secondary | ICD-10-CM | POA: Diagnosis not present

## 2017-06-22 DIAGNOSIS — I712 Thoracic aortic aneurysm, without rupture: Secondary | ICD-10-CM | POA: Diagnosis not present

## 2017-06-22 DIAGNOSIS — I7121 Aneurysm of the ascending aorta, without rupture: Secondary | ICD-10-CM

## 2017-06-22 NOTE — Progress Notes (Signed)
PCP is Elias Elseeade, Robert, MD Referring Provider is Elias Elseeade, Robert, MD  Chief Complaint  Patient presents with  . Thoracic Aortic Aneurysm    Surgical eval, ECHO 04/03/17, Chest CT 04/12/17  . Aortic Stenosis    HPI: Patient examined, echocardiogram and CT scan of chest images personally reviewed and counseled with patient  Very nice 83 year old Caucasian male who lives with his daughter's and has hypertension. He has a known cardiac murmur. He recently underwent echocardiogram which demonstrated EF of 60%. He has moderate AI, mild AS with a 15 mmHg gradient. There is mild-moderate MR with some calcification of the mitral annulus. There is no surgical indication for repair-replacement for his valves as he is asymptomatic. The patient denies angina. There is no family history of MI or cardiac surgery. CT scan shows moderate ascending fusiform aneurysm measuring 4.8 cm.in 2012 a CT scan was performed which demonstrated a 4.4 cm aneurysm. He has slowly increased in size 4 mm over 6 years. It is a symptom at a. There is no mural thrombus or ulceration. Guidelines for surgery are that aortic replacement is not indicated until the diameter of 5.5 cm. Best therapy is continued good blood pressure control. We will perform surveillance CT scans of his aorta starting at an  annual schedule. Echocardiogram should be repeated in 2 years.  Past Medical History:  Diagnosis Date  . Aortic stenosis, moderate   . Arrhythmia   . BPH (benign prostatic hyperplasia)    Dr. Mena GoesEskridge, UROLOGY  . Echocardiogram abnormal 04/03/2017   MILD TO MOD MR, MOD AV REGURG, MILD STENOSIS  . GERD (gastroesophageal reflux disease)   . Glaucoma    [facility]  . Heart murmur   . Hypercholesteremia    PURE  . Hypertension   . Thoracic aortic aneurysm without rupture [facility](HCC)     Past Surgical History:  Procedure Laterality Date  . APPENDECTOMY    . EYE SURGERY    . I&D EXTREMITY Left 03/02/2014   Procedure: Removal of Left foot mass;  Surgeon: Cheral AlmasNaiping Michael Xu, MD;  [facility];  Service: Orthopedics;  Laterality: Left;    No family history on file.  Social History Social History  Substance Use Topics  . Smoking status: Never Smoker  . Smokeless tobacco: Never Used  . Alcohol use No    Current Outpatient Prescriptions  Medication Sig Dispense Refill  . amLODipine (NORVASC) 5 MG tablet Take 5 mg by mouth daily.    . finasteride (PROSCAR) 5 MG tablet Take 5 mg by mouth daily.    . iron polysaccharides (NU-IRON) 150 MG capsule Take 150 mg by mouth daily.    Marland Kitchen. lisinopril (PRINIVIL,ZESTRIL) 20 MG tablet Take 20 mg by mouth daily.    . meloxicam (MOBIC) 15 MG tablet Take 15 mg by mouth every morning. With food    . metoprolol succinate (TOPROL-XL) 25 MG 24 hr tablet Take 25 mg by mouth daily.    . pantoprazole (PROTONIX) 40 MG tablet Take 40 mg by mouth every morning.    . ranitidine (ZANTAC) 300 MG tablet Take 300 mg by mouth at bedtime.    . travoprost, benzalkonium, (TRAVATAN) 0.004 % ophthalmic solution 1 drop at bedtime.    . triamcinolone cream (KENALOG) 0.1 % Apply 1 application topically 2 (two) times daily as needed.     No current facility-administered medications for this visit.     No Known Allergies  Review of Systems  Lives with his  daughter's Rides a stationary bicycle 10 minutes today and walks his dog around the block twice a day He lifts 5 pound dumbbells daily as well He has had no weight loss He's had recent surgery on his left foot for osteomyelitis and currently walks with a limp. No problems with a general anesthesia He has no dental complaints and has a full upper plate. He has glaucoma but no visual symptoms He denies diabetes He has GERD but no difficulty swallowing  BP (!) 160/70   Pulse (!) 57   Resp 20   Ht 5\' 4"  (1.626 m)   Wt 142 lb (64.4 kg)   SpO2 93% Comment: RA  BMI 24.37 kg/m  Physical Exam      Exam    General- alert  and comfortable   Neck -  no JVD, strong carotid pulses -    Lungs- clear without rales, wheezes   Cor- regular rate and rhythm, 3/6 systolic murmur of MR, 1/6 diastolic  murmur   of AI  , no gallop   Abdomen- soft, non-tender   Extremities - warm, non-tender, minimal edemaLeft foot with a walking boot    Neuro- oriented, appropriate, no focal weakness   Diagnostic Tests:  echocardiogram shows normal LV function with some LVH, hypertension and moderate AI, mild-moderate MR   CT scan of chest shows a fusiform ascending aneurysm, moderate in size measuring 4.8 cm.  Impression:  no surgical disease of his heart or thoracic aorta   surveillance scans of his thoracic aorta are indicated in this will be scheduled in one year and I'll see him in the office after the scan  Plan:Return in one year with CT A of thoracic aorta    Mikey BussingPeter Van Trigt III, MD [facility] [phone]

## 2018-05-10 ENCOUNTER — Other Ambulatory Visit: Payer: Self-pay | Admitting: *Deleted

## 2018-05-10 DIAGNOSIS — I712 Thoracic aortic aneurysm, without rupture, unspecified: Secondary | ICD-10-CM

## 2018-06-12 ENCOUNTER — Ambulatory Visit: Payer: Medicare Other | Admitting: Cardiothoracic Surgery

## 2018-06-12 ENCOUNTER — Encounter: Payer: Self-pay | Admitting: Cardiothoracic Surgery

## 2018-06-12 ENCOUNTER — Ambulatory Visit
Admission: RE | Admit: 2018-06-12 | Discharge: 2018-06-12 | Disposition: A | Discharge status: Home / Self Care | Payer: Medicare Other | Source: Ambulatory Visit | Attending: Cardiothoracic Surgery | Admitting: Cardiothoracic Surgery

## 2018-06-12 ENCOUNTER — Other Ambulatory Visit: Payer: Self-pay

## 2018-06-12 VITALS — BP 162/80 | HR 64 | Resp 16 | Ht 64.0 in | Wt 142.0 lb

## 2018-06-12 DIAGNOSIS — I712 Thoracic aortic aneurysm, without rupture, unspecified: Secondary | ICD-10-CM

## 2018-06-12 DIAGNOSIS — I7121 Aneurysm of the ascending aorta, without rupture: Secondary | ICD-10-CM

## 2018-06-12 MED ORDER — IOPAMIDOL (ISOVUE-370) INJECTION 76%
75.0000 mL | Freq: Once | INTRAVENOUS | Status: AC | PRN
  Administered 2018-06-12: 75 mL via INTRAVENOUS

## 2018-06-12 NOTE — Progress Notes (Signed)
PCP is Elias Else, MD Referring Provider is Elias Else, MD  Chief Complaint  Patient presents with  . Thoracic Aortic Aneurysm    1 year f/u with CTA Chest     HPI: One year follow-up with CTA for a fusiform ascending aneurysm stable at 4.4 cm, asymptomatic.  Echo 2018 showed mild aortic stenosis, 3 leaflet valve.  Patient has hypertension but is compliant with his medications including Norvasc, metoprolol, lisinopril.  He has blood pressure checked at his primary care office 3 times a year.  Today CTA shows no change with a ascending aneurysm measuring at 4.4 cm.  There is no evidence of intramural hematoma or ulceration.  Mild calcification.  Past Medical History:  Diagnosis Date  . Aortic stenosis, moderate   . Arrhythmia   . BPH (benign prostatic hyperplasia)    Dr. Mena Goes, UROLOGY  . Echocardiogram abnormal 04/03/2017   MILD TO MOD MR, MOD AV REGURG, MILD STENOSIS  . GERD (gastroesophageal reflux disease)   . Glaucoma    followed by Dr. Theodis Aguas, [facility]  . Heart murmur   . Hypercholesteremia    PURE  . Hypertension   . Thoracic aortic aneurysm without rupture [facility])     Past Surgical History:  Procedure Laterality Date  . APPENDECTOMY    . EYE SURGERY    . I&D EXTREMITY Left 03/02/2014   Procedure: Removal of Left foot mass;  Surgeon: Cheral Almas, MD;  Location: [facility] OR;  Service: Orthopedics;  Laterality: Left;    History reviewed. No pertinent family history.  Social History Social History   Tobacco Use  . Smoking status: Never Smoker  . Smokeless tobacco: Never Used  Substance Use Topics  . Alcohol use: No  . Drug use: No    Current Outpatient Medications  Medication Sig Dispense Refill  . amLODipine (NORVASC) 5 MG tablet Take 5 mg by mouth daily.    . finasteride (PROSCAR) 5 MG tablet Take 5 mg by mouth daily.    . iron polysaccharides (NU-IRON) 150 MG capsule Take 150 mg by mouth daily.    Marland Kitchen lisinopril (PRINIVIL,ZESTRIL) 20 MG  tablet Take 20 mg by mouth daily.    . meloxicam (MOBIC) 15 MG tablet Take 15 mg by mouth daily.    . metoprolol succinate (TOPROL-XL) 25 MG 24 hr tablet Take 25 mg by mouth daily.    . metoprolol succinate (TOPROL-XL) 50 MG 24 hr tablet Take 50 mg by mouth daily. Take with or immediately following a meal.    . ranitidine (ZANTAC) 300 MG tablet Take 300 mg by mouth at bedtime.    . travoprost, benzalkonium, (TRAVATAN) 0.004 % ophthalmic solution 1 drop at bedtime.    . triamcinolone cream (KENALOG) 0.1 % Apply 1 application topically 2 (two) times daily as needed.     No current facility-administered medications for this visit.     No Known Allergies  Review of Systems   Recent left foot surgery and some difficulty walking. No chest pain shortness breath hemoptysis orthopnea presyncope or dental complaints  BP (!) 162/80 (BP Location: Left Arm, Patient Position: Sitting, Cuff Size: Normal)   Pulse 64   Resp 16   Ht 5\' 4"  (1.626 m)   Wt 142 lb (64.4 kg)   SpO2 96% Comment: RA  BMI 24.37 kg/m  Physical Exam Alert and comfortable Lungs clear No JVD 2/6 systolic ejection murmur, stable Peripheral pulses intact  Diagnostic Tests: CT images personally reviewed and counseled with patient  showing a stable smooth walled 4.4 cm ascending aneurysm  Impression: Risk of dissection less than 1% at current dimension.  Best therapy is blood pressure control and surveillance scans.  Plan: Patient will return with CTA in 1 year.  We will also obtain a echocardiogram to follow his mild-moderate aortic valve disease.  Continue current medications. Mikey BussingPeter Van Trigt III, MD [facility] [phone]

## 2019-02-25 ENCOUNTER — Other Ambulatory Visit: Payer: Self-pay

## 2019-02-25 ENCOUNTER — Encounter: Payer: Self-pay | Admitting: Sports Medicine

## 2019-02-25 ENCOUNTER — Ambulatory Visit: Payer: Medicare Other | Admitting: Sports Medicine

## 2019-02-25 VITALS — Temp 98.6°F

## 2019-02-25 DIAGNOSIS — L03032 Cellulitis of left toe: Secondary | ICD-10-CM | POA: Diagnosis not present

## 2019-02-25 DIAGNOSIS — L03031 Cellulitis of right toe: Secondary | ICD-10-CM

## 2019-02-25 DIAGNOSIS — M79675 Pain in left toe(s): Secondary | ICD-10-CM | POA: Diagnosis not present

## 2019-02-25 DIAGNOSIS — M79674 Pain in right toe(s): Secondary | ICD-10-CM | POA: Diagnosis not present

## 2019-02-25 MED ORDER — NEOMYCIN-POLYMYXIN-HC 3.5-10000-1 OT SOLN: OTIC | 0 refills | Status: AC

## 2019-02-25 MED ORDER — AMOXICILLIN-POT CLAVULANATE 875-125 MG PO TABS: 1.0000 | ORAL_TABLET | Freq: Two times a day (BID) | ORAL | 0 refills | Status: DC

## 2019-02-25 NOTE — Progress Notes (Signed)
Subjective: Selestino Grainer is a 85 y.o. male patient presents to office today complaining of a moderately painful big toenails with redness and swelling around the toes and nails. Reports that this has been present for 3-4 weeks; thinks it may be from his shoes being too small and rubbing on his toes. Patient has treated this by peroxide to toes. Patient denies fever/chills/nausea/vomitting/any other related constitutional symptoms at this time.  Patient Active Problem List   Diagnosis Date Noted  . GERD (gastroesophageal reflux disease)   . Hypertension   . Thoracic aortic aneurysm without rupture (HCC)   . Glaucoma   . Hypercholesteremia   . Aortic stenosis, moderate   . BPH (benign prostatic hyperplasia)   . Echocardiogram abnormal 04/03/2017  . Mass of left foot 02/26/2014    Current Outpatient Medications on File Prior to Visit  Medication Sig Dispense Refill  . amLODipine (NORVASC) 5 MG tablet Take 5 mg by mouth daily.    . finasteride (PROSCAR) 5 MG tablet Take 5 mg by mouth daily.    . iron polysaccharides (NU-IRON) 150 MG capsule Take 150 mg by mouth daily.    Marland Kitchen lisinopril (PRINIVIL,ZESTRIL) 20 MG tablet Take 20 mg by mouth daily.    . meloxicam (MOBIC) 15 MG tablet Take 15 mg by mouth daily.    . metoprolol succinate (TOPROL-XL) 25 MG 24 hr tablet Take 25 mg by mouth daily.    . metoprolol succinate (TOPROL-XL) 50 MG 24 hr tablet Take 50 mg by mouth daily. Take with or immediately following a meal.    . ranitidine (ZANTAC) 300 MG tablet Take 300 mg by mouth at bedtime.    . travoprost, benzalkonium, (TRAVATAN) 0.004 % ophthalmic solution 1 drop at bedtime.    . triamcinolone cream (KENALOG) 0.1 % Apply 1 application topically 2 (two) times daily as needed.     No current facility-administered medications on file prior to visit.     No Known Allergies  Objective:  There were no vitals filed for this visit.  General: Well developed, nourished, in no acute distress, alert  and oriented x3   Dermatology: Skin is warm, dry and supple bilateral. Right hallux nail appears to be  severely incurvated with hyperkeratosis formation at the distal aspects of  the medial and lateral nail border and proximal nail fold. (+) Erythema. (+) Edema. (+) serosanguous  drainage present. Left hallux nail appears to be partially attached with hypergranular tissue formation at the distal aspects of  the medial and lateral nail border and proximal nail fold. (+) Erythema. (+) Edema. (+) serosanguous  drainage present. The remaining nails appear unremarkable at this time. There are no open sores, lesions or other signs of infection  present.  Vascular: Dorsalis Pedis artery and Posterior Tibial artery pedal pulses are 1/4 bilateral with immedate capillary fill time.Scant Pedal hair growth present. Minimal lower extremity edema.   Neruologic: Grossly intact via light touch bilateral however protective sensation diminished bilateral.   Musculoskeletal: Tenderness to palpation of the right and left hallux at nails. Muscular strength within normal limits in all groups bilateral. +digital deformity.   Assesement and Plan: Problem List Items Addressed This Visit    None    Visit Diagnoses    Paronychia of great toe of left foot    -  Primary   Paronychia of great toe, right       Toe pain, bilateral          -Discussed treatment alternatives and plan of  care; Explained permanent/temporary nail avulsion and post procedure course to patient. Patient elects for Total temp nail avulsions bilateral hallux  - After a verbal and written consent, injected 3 ml of a 50:50 mixture of 2% plain  lidocaine and 0.5% plain marcaine in a normal hallux block fashion. Next, a  betadine prep was performed. Anesthesia was tested and found to be appropriate.  The offending Left and Right hallux nail and all nonviable tissue was then incised from the hyponychium to the epinychium. The offending nail and  tissue was removed and cleared from the field. The area was curretted for any remaining nail or spicules. Then flushed with saline and dressed with antibiotic cream and a dry sterile dressing. -Patient was instructed to leave the dressing intact for today and begin soaking  in a weak solution of betadine or Epsom salt and water tomorrow. Patient was instructed to  soak for 15-20 minutes each day and apply corticosporin and a gauze or bandaid dressing each day. -Rx Augmentin to take as instructed  -Patient was instructed to monitor the toe for signs of infection and return to office if toe becomes red, hot or swollen. -Advised ice, elevation, and tylenol or motrin if needed for pain.  -Patient is to return in 2 weeks for follow up care/nail check or sooner if problems arise.  Asencion Islam, DPM

## 2019-02-25 NOTE — Patient Instructions (Signed)

## 2019-03-11 ENCOUNTER — Encounter: Payer: Self-pay | Admitting: Sports Medicine

## 2019-03-11 ENCOUNTER — Other Ambulatory Visit: Payer: Self-pay

## 2019-03-11 ENCOUNTER — Ambulatory Visit: Payer: Medicare Other | Admitting: Sports Medicine

## 2019-03-11 VITALS — Temp 98.1°F

## 2019-03-11 DIAGNOSIS — M79675 Pain in left toe(s): Secondary | ICD-10-CM

## 2019-03-11 DIAGNOSIS — L03032 Cellulitis of left toe: Secondary | ICD-10-CM

## 2019-03-11 DIAGNOSIS — M79674 Pain in right toe(s): Secondary | ICD-10-CM

## 2019-03-11 DIAGNOSIS — L03031 Cellulitis of right toe: Secondary | ICD-10-CM

## 2019-03-11 NOTE — Progress Notes (Signed)
Subjective: Jorge Long is a 85 y.o. male patient returns to office today for follow up evaluation after having Right and Left Hallux total temporary nail avulsion performed on (02-25-19). Patient has been soaking using epsom salt and applying topical antibiotic drops covered with bandaid daily. Patient deniesfever/chills/nausea/vomitting/any other related constitutional symptoms at this time.  Patient Active Problem List   Diagnosis Date Noted  . GERD (gastroesophageal reflux disease)   . Hypertension   . Thoracic aortic aneurysm without rupture (HCC)   . Glaucoma   . Hypercholesteremia   . Aortic stenosis, moderate   . BPH (benign prostatic hyperplasia)   . Echocardiogram abnormal 04/03/2017  . Mass of left foot 02/26/2014    Current Outpatient Medications on File Prior to Visit  Medication Sig Dispense Refill  . amLODipine (NORVASC) 5 MG tablet Take 5 mg by mouth daily.    Marland Kitchen amoxicillin-clavulanate (AUGMENTIN) 875-125 MG tablet Take 1 tablet by mouth 2 (two) times daily. 28 tablet 0  . finasteride (PROSCAR) 5 MG tablet Take 5 mg by mouth daily.    . iron polysaccharides (NU-IRON) 150 MG capsule Take 150 mg by mouth daily.    Marland Kitchen latanoprost (XALATAN) 0.005 % ophthalmic solution     . lisinopril (PRINIVIL,ZESTRIL) 20 MG tablet Take 20 mg by mouth daily.    . meloxicam (MOBIC) 15 MG tablet Take 15 mg by mouth daily.    . metoprolol succinate (TOPROL-XL) 25 MG 24 hr tablet Take 25 mg by mouth daily.    . metoprolol succinate (TOPROL-XL) 50 MG 24 hr tablet Take 50 mg by mouth daily. Take with or immediately following a meal.    . neomycin-polymyxin-hydrocortisone (CORTISPORIN) OTIC solution Apply 1-2 drops to toes twice a day 10 mL 0  . pantoprazole (PROTONIX) 40 MG tablet     . ranitidine (ZANTAC) 300 MG tablet Take 300 mg by mouth at bedtime.    . travoprost, benzalkonium, (TRAVATAN) 0.004 % ophthalmic solution 1 drop at bedtime.    . triamcinolone cream (KENALOG) 0.1 % Apply 1  application topically 2 (two) times daily as needed.     No current facility-administered medications on file prior to visit.     No Known Allergies  Objective:  General: Well developed, nourished, in no acute distress, alert and oriented x3   Dermatology: Skin is warm, dry and supple bilateral. Right and Left hallux  nail bed appears to be clean, dry, with mild granular tissue and surrounding eschar/scab. (-) Erythema. (-) Edema. (-) serosanguous drainage present. The remaining nails appear unremarkable at this time. There are no other lesions or other signs of infection  present.  Neurovascular status: Intact. No lower extremity swelling; No pain with calf compression bilateral.  Musculoskeletal: Decreased tenderness to palpation of the bilateral hallux nail fold(s). Muscular strength within normal limits bilateral.   Assesement and Plan: Problem List Items Addressed This Visit    None    Visit Diagnoses    Paronychia of great toe of left foot    -  Primary   Paronychia of great toe, right       Toe pain, bilateral          -Examined patient  -Cleansed right and left hallux nail fold and gently scrubbed with peroxide and q-tip/curetted away eschar at site and applied antibiotic cream covered with bandaid.  -Discussed plan of care with patient. -Patient to now begin soaking in a weak solution of Epsom salt and warm water for 1 more week. Patient was  instructed to soak for 15-20 minutes each day until the toe appears normal and there is no drainage, redness, tenderness, or swelling at the procedure site, and apply neosporin and a gauze or bandaid dressing each day as needed. May leave open to air at night. -Educated patient on long term care after nail surgery. -Patient was instructed to monitor the toe for reoccurrence and signs of infection; Patient advised to return to office or go to ER if toe becomes red, hot or swollen. -Patient is to return as needed or sooner if problems  arise.  Asencion Islamitorya Xavier Munger, DPM

## 2019-04-01 ENCOUNTER — Other Ambulatory Visit: Payer: Self-pay | Admitting: Cardiothoracic Surgery

## 2019-04-01 DIAGNOSIS — I712 Thoracic aortic aneurysm, without rupture, unspecified: Secondary | ICD-10-CM

## 2019-04-01 DIAGNOSIS — I350 Nonrheumatic aortic (valve) stenosis: Secondary | ICD-10-CM

## 2019-04-04 ENCOUNTER — Telehealth (HOSPITAL_COMMUNITY): Payer: Self-pay | Admitting: Radiology

## 2019-04-04 NOTE — Telephone Encounter (Signed)

## 2019-04-07 ENCOUNTER — Ambulatory Visit (HOSPITAL_COMMUNITY): Payer: Medicare Other | Attending: Cardiology

## 2019-04-07 ENCOUNTER — Other Ambulatory Visit: Payer: Self-pay

## 2019-04-07 DIAGNOSIS — I350 Nonrheumatic aortic (valve) stenosis: Secondary | ICD-10-CM | POA: Insufficient documentation

## 2019-06-11 ENCOUNTER — Ambulatory Visit
Admission: RE | Admit: 2019-06-11 | Discharge: 2019-06-11 | Disposition: A | Discharge status: Home / Self Care | Payer: Medicare Other | Source: Ambulatory Visit | Attending: Cardiothoracic Surgery | Admitting: Cardiothoracic Surgery

## 2019-06-11 ENCOUNTER — Other Ambulatory Visit: Payer: Self-pay

## 2019-06-11 ENCOUNTER — Ambulatory Visit: Payer: Medicare Other | Admitting: Cardiothoracic Surgery

## 2019-06-11 ENCOUNTER — Encounter: Payer: Self-pay | Admitting: Cardiothoracic Surgery

## 2019-06-11 VITALS — BP 163/78 | HR 69 | Resp 18 | Ht 64.0 in | Wt 140.0 lb

## 2019-06-11 DIAGNOSIS — I712 Thoracic aortic aneurysm, without rupture, unspecified: Secondary | ICD-10-CM

## 2019-06-11 MED ORDER — IOPAMIDOL (ISOVUE-370) INJECTION 76%
75.0000 mL | Freq: Once | INTRAVENOUS | Status: AC | PRN
  Administered 2019-06-11: 75 mL via INTRAVENOUS

## 2019-06-11 NOTE — Progress Notes (Signed)
PCP is Maury Dus, MD Referring Provider is Maury Dus, MD  Chief Complaint  Patient presents with  . Thoracic Aortic Aneurysm    f/u with chest CT    HPI: 85 year old hypertensive male with moderate fusiform ascending aneurysm 4.5 cm, asymptomatic and stable since initially evaluated 2017.  Patient has his blood pressure checked by his primary care physician several times here.  He is compliant with his antihypertensive medication including metoprolol, amlodipine, and lisinopril.  A systolic murmur was noted on his last exam and an echocardiogram was performed 2 months ago.  This demonstrates normal LV systolic function with LVH, mild aortic stenosis, mild-moderate aortic insufficiency with a tricuspid aortic valve and moderately dilated ascending aorta as noted on CTA.  Today's CT images are personally reviewed.  There is been no change in his ascending aortic diameter which remains at 4.5 cm.  The aortic wall is smooth without intramural hematoma or ulceration.  Lung windows show no active disease.   Past Medical History:  Diagnosis Date  . Aortic stenosis, moderate   . Arrhythmia   . BPH (benign prostatic hyperplasia)    Dr. Junious Silk, UROLOGY  . Echocardiogram abnormal 04/03/2017   MILD TO MOD MR, MOD AV REGURG, MILD STENOSIS  . GERD (gastroesophageal reflux disease)   . Glaucoma    followed by Dr. Lindalou Hose, [facility]  . Heart murmur   . Hypercholesteremia    PURE  . Hypertension   . Thoracic aortic aneurysm without rupture [facility])     Past Surgical History:  Procedure Laterality Date  . APPENDECTOMY    . EYE SURGERY    . I&D EXTREMITY Left 03/02/2014   Procedure: Removal of Left foot mass;  Surgeon: Marianna Payment, MD;  Location: [place];  Service: Orthopedics;  Laterality: Left;    History reviewed. No pertinent family history.  Social History Social History   Tobacco Use  . Smoking status: Never Smoker  . Smokeless tobacco: Never Used  Substance Use  Topics  . Alcohol use: No  . Drug use: No    Current Outpatient Medications  Medication Sig Dispense Refill  . amLODipine (NORVASC) 5 MG tablet Take 5 mg by mouth daily.    . finasteride (PROSCAR) 5 MG tablet Take 5 mg by mouth daily.    . iron polysaccharides (NU-IRON) 150 MG capsule Take 150 mg by mouth daily.    Marland Kitchen lisinopril (PRINIVIL,ZESTRIL) 20 MG tablet Take 20 mg by mouth daily.    . meloxicam (MOBIC) 15 MG tablet Take 15 mg by mouth daily.    . metoprolol succinate (TOPROL-XL) 25 MG 24 hr tablet Take 25 mg by mouth daily.    Marland Kitchen neomycin-polymyxin-hydrocortisone (CORTISPORIN) OTIC solution Apply 1-2 drops to toes twice a day 10 mL 0  . ranitidine (ZANTAC) 300 MG tablet Take 300 mg by mouth at bedtime.    . travoprost, benzalkonium, (TRAVATAN) 0.004 % ophthalmic solution 1 drop at bedtime.    Marland Kitchen latanoprost (XALATAN) 0.005 % ophthalmic solution     . triamcinolone cream (KENALOG) 0.1 % Apply 1 application topically 2 (two) times daily as needed.     No current facility-administered medications for this visit.     No Known Allergies  Review of Systems  Weight stable Walks 20 minutes a day without chest pain or shortness of breath No leg edema No fever cough headache diarrhea No falls or syncope  BP (!) 163/78 (BP Location: Left Arm, Patient Position: Sitting, Cuff Size: Normal)  Pulse 69   Resp 18   Ht 5\' 4"  (1.626 m)   Wt 140 lb (63.5 kg)   SpO2 96% Comment: RA  BMI 24.03 kg/m  Physical Exam      Exam    General- alert and comfortable    Neck- no JVD, no cervical adenopathy palpable, no carotid bruit   Lungs- clear without rales, wheezes   Cor- regular rate and rhythm, n 2/6 SEM ,  No gallop   Abdomen- soft, non-tender   Extremities - warm, non-tender, minimal edema   Neuro- oriented, appropriate, no focal weakness   Diagnostic Tests: CT images personally reviewed, echocardiogram images personally reviewed with findings as noted above  Impression: Stable  moderate fusiform ascending aortic aneurysm 4.5 cm.  Very low risk of dissection.  Best treatment for this 85 year old gentleman is continued blood pressure control and surveillance scan. Mild aortic stenosis, mild left ear moderate aortic insufficiency with normal LV systolic function with LVH.  No symptoms of diastolic heart failure.  Plan: Continue annual CTA scan of ascending thoracic fusiform aneurysm.  Patient understands importance of blood pressure control and avoidance of lifting heavy weights.  Mikey BussingPeter Van Trigt III, MD [facility] [phone]

## 2019-10-24 ENCOUNTER — Encounter (HOSPITAL_COMMUNITY): Payer: Self-pay

## 2019-10-24 ENCOUNTER — Emergency Department (HOSPITAL_COMMUNITY)
Admission: EM | Admit: 2019-10-24 | Discharge: 2019-10-25 | Disposition: A | Discharge status: Home / Self Care | Payer: Medicare Other | Attending: Emergency Medicine | Admitting: Emergency Medicine

## 2019-10-24 DIAGNOSIS — I10 Essential (primary) hypertension: Secondary | ICD-10-CM | POA: Diagnosis not present

## 2019-10-24 DIAGNOSIS — M79672 Pain in left foot: Secondary | ICD-10-CM | POA: Insufficient documentation

## 2019-10-24 DIAGNOSIS — Z79899 Other long term (current) drug therapy: Secondary | ICD-10-CM | POA: Diagnosis not present

## 2019-10-24 NOTE — ED Triage Notes (Signed)
Pt presents with c/o left foot infection. States he injured the area x30 years ago, was never properly treated, and has had trouble with it ever since. Possible abscess noted to outer foot. Pt states he has had multiple surgeries on same foot to remove "infection". Pt states pain causes difficulty with ambulation.

## 2019-10-25 ENCOUNTER — Emergency Department (HOSPITAL_COMMUNITY): Payer: Medicare Other

## 2019-10-25 LAB — CBC WITH DIFFERENTIAL/PLATELET
Abs Immature Granulocytes: 0.03 10*3/uL (ref 0.00–0.07)
Basophils Absolute: 0.1 10*3/uL (ref 0.0–0.1)
Basophils Relative: 1 %
Eosinophils Absolute: 0.1 10*3/uL (ref 0.0–0.5)
Eosinophils Relative: 1 %
HCT: 40 % (ref 39.0–52.0)
Hemoglobin: 13.7 g/dL (ref 13.0–17.0)
Immature Granulocytes: 0 %
Lymphocytes Relative: 8 %
Lymphs Abs: 0.7 10*3/uL (ref 0.7–4.0)
MCH: 29.7 pg (ref 26.0–34.0)
MCHC: 34.3 g/dL (ref 30.0–36.0)
MCV: 86.6 fL (ref 80.0–100.0)
Monocytes Absolute: 1.1 10*3/uL — ABNORMAL HIGH (ref 0.1–1.0)
Monocytes Relative: 12 %
Neutro Abs: 7.5 10*3/uL (ref 1.7–7.7)
Neutrophils Relative %: 78 %
Platelets: 195 10*3/uL (ref 150–400)
RBC: 4.62 MIL/uL (ref 4.22–5.81)
RDW: 13.3 % (ref 11.5–15.5)
WBC: 9.4 10*3/uL (ref 4.0–10.5)
nRBC: 0 % (ref 0.0–0.2)

## 2019-10-25 LAB — BASIC METABOLIC PANEL WITH GFR
Anion gap: 11 (ref 5–15)
BUN: 18 mg/dL (ref 8–23)
CO2: 24 mmol/L (ref 22–32)
Calcium: 8.7 mg/dL — ABNORMAL LOW (ref 8.9–10.3)
Chloride: 100 mmol/L (ref 98–111)
Creatinine, Ser: 0.8 mg/dL (ref 0.61–1.24)
GFR calc Af Amer: >60 mL/min
GFR calc non Af Amer: >60 mL/min
Glucose, Bld: 102 mg/dL — ABNORMAL HIGH (ref 70–99)
Potassium: 4 mmol/L (ref 3.5–5.1)
Sodium: 135 mmol/L (ref 135–145)

## 2019-10-25 MED ORDER — GADOBUTROL 1 MMOL/ML IV SOLN
6.0000 mL | Freq: Once | INTRAVENOUS | Status: AC | PRN
  Administered 2019-10-25: 6 mL via INTRAVENOUS

## 2019-10-25 MED ORDER — CEPHALEXIN 500 MG PO CAPS: 500.0000 mg | ORAL_CAPSULE | Freq: Two times a day (BID) | ORAL | 0 refills | Status: AC

## 2019-10-25 MED ORDER — CEPHALEXIN 250 MG PO CAPS
500.0000 mg | ORAL_CAPSULE | Freq: Once | ORAL | Status: AC
  Administered 2019-10-25: 500 mg via ORAL
  Filled 2019-10-25: qty 2

## 2019-10-25 NOTE — Discharge Instructions (Signed)
As discussed, it is very portly follow-up with your surgeon in the office in the coming days.  Please take all medication as directed.  Return here for concerning changes in your condition.

## 2019-10-25 NOTE — ED Provider Notes (Signed)
[facility] EMERGENCY DEPARTMENT Provider Note   CSN: [id number] Arrival date & time: 10/24/19  2218     History   Chief Complaint Chief Complaint  Patient presents with  . Foot Pain    HPI Jorge Long is a 85 y.o. male.     HPI  This is an 85 year old male with a history of pretension, hypercholesterolemia, aortic aneurysm without rupture, prior infection of the left foot requiring I&D in 2015 by Dr. Erlinda Hong who presents with left foot pain.  Patient reports isolated injury approximately 30 years ago to the left foot.  Since that time he states that he has had difficulty with recurrent infections.  In 2015 he was seen by orthopedics, Dr. Erlinda Hong who took him to the OR for I&D.  He reports that part of his bone was removed.  He states over the last 5 days he has had increasing pain, swelling, redness of the foot.  He has not noted any fevers.  He reports pain specifically with ambulation.  Pain is minimal when not ambulating.  He has not noted any spontaneous drainage.  He denies any recent upper respiratory symptoms.  He denies any numbness or tingling in the foot.  Past Medical History:  Diagnosis Date  . Aortic stenosis, moderate   . Arrhythmia   . BPH (benign prostatic hyperplasia)    Dr. Junious Silk, UROLOGY  . Echocardiogram abnormal 04/03/2017   MILD TO MOD MR, MOD AV REGURG, MILD STENOSIS  . GERD (gastroesophageal reflux disease)   . Glaucoma    followed by Dr. Lindalou Hose, [facility]  . Heart murmur   . Hypercholesteremia    PURE  . Hypertension   . Thoracic aortic aneurysm without rupture [facility])     Patient Active Problem List   Diagnosis Date Noted  . GERD (gastroesophageal reflux disease)   . Hypertension   . Thoracic aortic aneurysm without rupture ([place])   . Glaucoma   . Hypercholesteremia   . Aortic stenosis, moderate   . BPH (benign prostatic hyperplasia)   . Echocardiogram abnormal 04/03/2017  . Mass of left foot 02/26/2014    Past  Surgical History:  Procedure Laterality Date  . APPENDECTOMY    . EYE SURGERY    . I&D EXTREMITY Left 03/02/2014   Procedure: Removal of Left foot mass;  Surgeon: Marianna Payment, MD;  Location: [facility];  Service: Orthopedics;  Laterality: Left;        Home Medications    Prior to Admission medications   Medication Sig Start Date End Date Taking? Authorizing Provider  amLODipine (NORVASC) 5 MG tablet Take 5 mg by mouth daily.    [provider]  finasteride (PROSCAR) 5 MG tablet Take 5 mg by mouth daily.    [provider]  iron polysaccharides (NU-IRON) 150 MG capsule Take 150 mg by mouth daily.    [provider]  latanoprost (XALATAN) 0.005 % ophthalmic solution  02/10/19   [provider]  lisinopril (PRINIVIL,ZESTRIL) 20 MG tablet Take 20 mg by mouth daily.    [provider]  meloxicam (MOBIC) 15 MG tablet Take 15 mg by mouth daily.    [provider]  metoprolol succinate (TOPROL-XL) 25 MG 24 hr tablet Take 25 mg by mouth daily.    [provider]  neomycin-polymyxin-hydrocortisone (CORTISPORIN) OTIC solution Apply 1-2 drops to toes twice a day 02/25/19   Landis Martins, DPM  ranitidine (ZANTAC) 300 MG tablet Take 300 mg by mouth at  bedtime.    [provider]  travoprost, benzalkonium, (TRAVATAN) 0.004 % ophthalmic solution 1 drop at bedtime.    [provider]  triamcinolone cream (KENALOG) 0.1 % Apply 1 application topically 2 (two) times daily as needed.    [provider]    Family History No family history on file.  Social History Social History   Tobacco Use  . Smoking status: Never Smoker  . Smokeless tobacco: Never Used  Substance Use Topics  . Alcohol use: No  . Drug use: No     Allergies   Patient has no known allergies.   Review of Systems Review of Systems  Constitutional: Negative for fever.  Respiratory: Negative for shortness of breath.   Cardiovascular:  Negative for chest pain.  Gastrointestinal: Negative for abdominal pain.  Musculoskeletal:       Left foot pain  Skin: Positive for color change.       Abscess  Neurological: Negative for headaches.  All other systems reviewed and are negative.    Physical Exam Updated Vital Signs BP (!) 152/87 (BP Location: Left Arm)   Pulse 64   Temp 97.8 F (36.6 C) (Oral)   Resp 16   SpO2 100%   Physical Exam Vitals signs and nursing note reviewed.  Constitutional:      General: He is not in acute distress.    Appearance: He is well-developed. He is not ill-appearing.  HENT:     Head: Normocephalic and atraumatic.     Mouth/Throat:     Mouth: Mucous membranes are moist.  Eyes:     Pupils: Pupils are equal, round, and reactive to light.  Neck:     Musculoskeletal: Neck supple.  Cardiovascular:     Rate and Rhythm: Normal rate and regular rhythm.  Pulmonary:     Effort: Pulmonary effort is normal. No respiratory distress.  Musculoskeletal:        General: Swelling present.     Right lower leg: No edema.     Left lower leg: No edema.     Comments: Focused examination of left foot with swelling noted over the dorsum of the foot, there is fluctuance and a wound over the lateral aspect of the foot which is protruding and indurated, slight erythema and warmth adjacent, 1+ DP pulse  Skin:    General: Skin is warm and dry.  Neurological:     Mental Status: He is alert and oriented to person, place, and time.  Psychiatric:        Mood and Affect: Mood normal.        ED Treatments / Results  Labs (all labs ordered are listed, but only abnormal results are displayed) Labs Reviewed  CBC WITH DIFFERENTIAL/PLATELET  BASIC METABOLIC PANEL    EKG None  Radiology No results found.  Procedures Procedures (including critical care time)  Medications Ordered in ED Medications - No data to display   Initial Impression / Assessment and Plan / ED Course  I have reviewed the  triage vital signs and the nursing notes.  Pertinent labs & imaging results that were available during my care of the patient were reviewed by me and considered in my medical decision making (see chart for details).        Patient presents with concerns for recurrent infection of the left foot.  Patient had excision of a left foot mass in April 2015.  Subsequently noted to be abscess.  I do not see anywhere in his op  note where he had been removed.  He does have evidence of likely recurrent abscess in the same location.  He is overall nontoxic-appearing and afebrile.  Systemically well-appearing.  CBC, BMP and x-rays of the left foot obtained initially.  Patient signed out to oncoming provider.  Final Clinical Impressions(s) / ED Diagnoses   Final diagnoses:  None    ED Discharge Orders    None       Jaivian Battaglini, Mayer Maskerourtney F, MD 10/25/19 [phone]

## 2019-10-25 NOTE — ED Provider Notes (Signed)
10:52 AM Patient evaluated, and discussed MRI findings with him. I have already discussed the patient's case with our orthopedic colleague, Dr. Erlinda Hong as well. Dr. Erlinda Hong is a surgeon who performed excision 5 years ago. With no MRI evidence of osteomyelitis, recommendation is for single intervention for excision, rather than I&D today, and deeper procedure in the coming days. Patient is amenable to this treatment plan, will start a course of antibiotics, follow-up with Dr. Erlinda Hong in the office.   Carmin Muskrat, MD 10/25/19 1053

## 2019-10-25 NOTE — ED Notes (Signed)
Patient verbalizes understanding of discharge instructions. Opportunity for questioning and answers were provided. Armband removed by staff, pt discharged from ED.  

## 2019-10-27 ENCOUNTER — Encounter: Payer: Self-pay | Admitting: Orthopaedic Surgery

## 2019-10-27 ENCOUNTER — Other Ambulatory Visit: Payer: Self-pay

## 2019-10-27 ENCOUNTER — Ambulatory Visit: Payer: Medicare Other | Admitting: Orthopaedic Surgery

## 2019-10-27 DIAGNOSIS — S90822D Blister (nonthermal), left foot, subsequent encounter: Secondary | ICD-10-CM | POA: Diagnosis not present

## 2019-10-27 NOTE — Progress Notes (Signed)
Office Visit Note   Patient: Jorge Long           Date of Birth: [birth year]           MRN: [id number] Visit Date: 10/27/2019              Requested by: Maury Dus, MD [address] [place],  [place] [address] PCP: Maury Dus, MD   Assessment & Plan: Visit Diagnoses:  1. Blister (nonthermal), left foot, subsequent encounter     Plan: He understands that he has developed a blister due to his foot architecture and likely shoe wear.  After I unroofed the blister I did place mupirocin ointment on the wound and a dry dressing.  He will leave this on today.  Starting tomorrow he will soak his foot once daily and soapy antibacterial soapy water and then place a small thin layer of mupirocin ointment back on the wound and a dry dressing.  He should only soak his foot for about 15 to 20 minutes.  He can try Epson salt at least every other day.  He will finish the short course of antibiotics he is on because I do not see a harm to this.  He will follow up with Dr. Erlinda Hong next week unless things are worsening.  This is more of a chronic issue so it may require further surgery or changing his shoe wear.  All question concerns were answered and addressed.  Follow-Up Instructions: Return in about 1 week (around 11/03/2019). With Dr. Erlinda Hong  Orders:  No orders of the defined types were placed in this encounter.  No orders of the defined types were placed in this encounter.     Procedures: No procedures performed   Clinical Data: No additional findings.   Subjective: Chief Complaint  Patient presents with  . Left Foot - Pain  The patient is a very pleasant 85 year old gentleman who has a history of previous left foot surgery by my partner Dr. Erlinda Hong.  This was to remove a mass at the lateral aspect of his left foot over the fifth metatarsal area.  He has had a buildup of soft tissue in this area and then presented to the emergency room this past Friday evening with a blister and  concern for infection.  A MRI was obtained that showed soft tissue fluid but no evidence of an abscess and no evidence of osteomyelitis.  He was worked into the clinic this morning so we can make sure that everything looked okay.  He was given Keflex to take twice a day.  He does report pain over that aspect of the foot.  He denies any fever or chills.  HPI  Review of Systems He currently denies any headache, chest pain, shortness of breath, fever, chills, nausea, vomiting  Objective: Vital Signs: There were no vitals taken for this visit.  Physical Exam He is alert and orient x3 and in no acute distress Ortho Exam Examination of his left foot does show a blister over the fifth metatarsal area at the lateral aspect of the foot.  I did unroofed this.  There was no gross purulence and no evidence of infection. Specialty Comments:  No specialty comments available.  Imaging: No results found. The plain films and MRI of his left foot were reviewed and showed no worrisome findings for destruction of the bone or osteomyelitis.  PMFS History: Patient Active Problem List   Diagnosis Date Noted  . GERD (gastroesophageal reflux disease)   .  Hypertension   . Thoracic aortic aneurysm without rupture ([facility])   . Glaucoma   . Hypercholesteremia   . Aortic stenosis, moderate   . BPH (benign prostatic hyperplasia)   . Echocardiogram abnormal 04/03/2017  . Mass of left foot 02/26/2014   Past Medical History:  Diagnosis Date  . Aortic stenosis, moderate   . Arrhythmia   . BPH (benign prostatic hyperplasia)    Dr. Mena Goes, UROLOGY  . Echocardiogram abnormal 04/03/2017   MILD TO MOD MR, MOD AV REGURG, MILD STENOSIS  . GERD (gastroesophageal reflux disease)   . Glaucoma    followed by Dr. Theodis Aguas, [facility]  . Heart murmur   . Hypercholesteremia    PURE  . Hypertension   . Thoracic aortic aneurysm without rupture ([facility])     History reviewed. No pertinent family history.  Past  Surgical History:  Procedure Laterality Date  . APPENDECTOMY    . EYE SURGERY    . I&D EXTREMITY Left 03/02/2014   Procedure: Removal of Left foot mass;  Surgeon: Cheral Almas, MD;  Location: [facility] OR;  Service: Orthopedics;  Laterality: Left;   Social History   Occupational History  . Not on file  Tobacco Use  . Smoking status: Never Smoker  . Smokeless tobacco: Never Used  Substance and Sexual Activity  . Alcohol use: No  . Drug use: No  . Sexual activity: Not on file

## 2019-11-04 ENCOUNTER — Ambulatory Visit (INDEPENDENT_AMBULATORY_CARE_PROVIDER_SITE_OTHER): Payer: Medicare Other | Admitting: Orthopaedic Surgery

## 2019-11-04 ENCOUNTER — Encounter: Payer: Self-pay | Admitting: Orthopaedic Surgery

## 2019-11-04 ENCOUNTER — Other Ambulatory Visit: Payer: Self-pay

## 2019-11-04 DIAGNOSIS — S90822D Blister (nonthermal), left foot, subsequent encounter: Secondary | ICD-10-CM

## 2019-11-04 NOTE — Progress Notes (Signed)
Office Visit Note   Patient: Jorge Long           Date of Birth: 05-04-1933           MRN: [id number] Visit Date: 11/04/2019              Requested by: Elias Else, MD [phone] [place],  [place] [address] PCP: Elias Else, MD   Assessment & Plan: Visit Diagnoses: No diagnosis found.  Plan: Overall the blister is healing well.  I have recommended going to the shoe market for wider accommodative shoes.  I have also given him a prescription to [employer] for a custom offloading orthotic.  He will continue to put mupirocin on this blister.  Questions encouraged and answered.  MRI x-rays were reviewed today.  Follow-Up Instructions: No follow-ups on file.   Orders:  No orders of the defined types were placed in this encounter.  No orders of the defined types were placed in this encounter.     Procedures: No procedures performed   Clinical Data: No additional findings.   Subjective: Chief Complaint  Patient presents with  . Left Foot - Wound Check    Jorge Long returns today for recheck of his left foot.  He saw Dr. Magnus Ivan about a week ago and he unroofed the blister.  He states that he has no pain.  He has been doing local wound care to the foot.  Denies any constitutional symptoms.   Review of Systems  Constitutional: Negative.   All other systems reviewed and are negative.    Objective: Vital Signs: There were no vitals taken for this visit.  Physical Exam Vitals signs and nursing note reviewed.  Constitutional:      Appearance: He is well-developed.  Pulmonary:     Effort: Pulmonary effort is normal.  Abdominal:     Palpations: Abdomen is soft.  Skin:    General: Skin is warm.  Neurological:     Mental Status: He is alert and oriented to person, place, and time.  Psychiatric:        Behavior: Behavior normal.        Thought Content: Thought content normal.        Judgment: Judgment normal.     Ortho Exam Left foot shows a  healing unroofed blister.  There is no signs of infection.  It is slightly tender around the area which is to be expected. Specialty Comments:  No specialty comments available.  Imaging: No results found.   PMFS History: Patient Active Problem List   Diagnosis Date Noted  . GERD (gastroesophageal reflux disease)   . Hypertension   . Thoracic aortic aneurysm without rupture ([facility])   . Glaucoma   . Hypercholesteremia   . Aortic stenosis, moderate   . BPH (benign prostatic hyperplasia)   . Echocardiogram abnormal 04/03/2017  . Mass of left foot 02/26/2014   Past Medical History:  Diagnosis Date  . Aortic stenosis, moderate   . Arrhythmia   . BPH (benign prostatic hyperplasia)    Dr. Mena Goes, UROLOGY  . Echocardiogram abnormal 04/03/2017   MILD TO MOD MR, MOD AV REGURG, MILD STENOSIS  . GERD (gastroesophageal reflux disease)   . Glaucoma    followed by Dr. Theodis Aguas, [facility]  . Heart murmur   . Hypercholesteremia    PURE  . Hypertension   . Thoracic aortic aneurysm without rupture ([facility])     History reviewed. No pertinent family history.  Past Surgical  History:  Procedure Laterality Date  . APPENDECTOMY    . EYE SURGERY    . I&D EXTREMITY Left 03/02/2014   Procedure: Removal of Left foot mass;  Surgeon: Marianna Payment, MD;  Location: [facility];  Service: Orthopedics;  Laterality: Left;   Social History   Occupational History  . Not on file  Tobacco Use  . Smoking status: Never Smoker  . Smokeless tobacco: Never Used  Substance and Sexual Activity  . Alcohol use: No  . Drug use: No  . Sexual activity: Not on file

## 2020-01-06 ENCOUNTER — Telehealth: Payer: Self-pay | Admitting: Orthopaedic Surgery

## 2020-01-06 ENCOUNTER — Other Ambulatory Visit: Payer: Self-pay | Admitting: Physician Assistant

## 2020-01-06 MED ORDER — MUPIROCIN 2 % EX OINT: TOPICAL_OINTMENT | CUTANEOUS | 0 refills | Status: AC

## 2020-01-06 NOTE — Telephone Encounter (Signed)
Patient called. Says he is out of his ointment. Would like some called in to his pharmacy. His call back number is [phone]

## 2020-01-06 NOTE — Telephone Encounter (Signed)
Just sent in

## 2020-01-26 ENCOUNTER — Other Ambulatory Visit: Payer: Self-pay | Admitting: Family Medicine

## 2020-01-26 DIAGNOSIS — I739 Peripheral vascular disease, unspecified: Secondary | ICD-10-CM

## 2020-01-28 ENCOUNTER — Ambulatory Visit
Admission: RE | Admit: 2020-01-28 | Discharge: 2020-01-28 | Disposition: A | Discharge status: Home / Self Care | Payer: Medicare Other | Source: Ambulatory Visit | Attending: Family Medicine | Admitting: Family Medicine

## 2020-01-28 DIAGNOSIS — I739 Peripheral vascular disease, unspecified: Secondary | ICD-10-CM

## 2020-01-29 ENCOUNTER — Ambulatory Visit: Payer: Medicare Other | Attending: Internal Medicine

## 2020-01-29 DIAGNOSIS — Z23 Encounter for immunization: Secondary | ICD-10-CM

## 2020-01-29 NOTE — Progress Notes (Signed)
   Covid-19 Vaccination Clinic  Name:  Jorge Long    MRN: [id number] DOB: 08/23/1933  01/29/2020  Jorge Long was observed post Covid-19 immunization for 15 minutes without incident. He was provided with Vaccine Information Sheet and instruction to access the V-Safe system.   Jorge Long was instructed to call 911 with any severe reactions post vaccine: Marland Kitchen Difficulty breathing  . Swelling of face and throat  . A fast heartbeat  . A bad rash all over body  . Dizziness and weakness

## 2020-02-25 ENCOUNTER — Ambulatory Visit: Payer: Medicare Other | Attending: Internal Medicine

## 2020-02-25 DIAGNOSIS — Z23 Encounter for immunization: Secondary | ICD-10-CM

## 2020-02-25 NOTE — Progress Notes (Signed)
   Covid-19 Vaccination Clinic  Name:  Jorge Long    MRN: [id number] DOB: [birth year]  02/25/2020  Mr. Heal was observed post Covid-19 immunization for 15 minutes without incident. He was provided with Vaccine Information Sheet and instruction to access the V-Safe system.   [name] was instructed to call 911 with any severe reactions post vaccine: Marland Kitchen Difficulty breathing  . Swelling of face and throat  . A fast heartbeat  . A bad rash all over body  . Dizziness and weakness   Immunizations Administered    Name Date Dose VIS Date Route   Pfizer COVID-19 Vaccine 02/25/2020 12:41 PM 0.3 mL 11/07/2019 Intramuscular   Manufacturer: [employer], [employer]   Lot: [id number]   NDC: [address]-8

## 2020-05-07 ENCOUNTER — Other Ambulatory Visit: Payer: Self-pay | Admitting: *Deleted

## 2020-05-07 DIAGNOSIS — I712 Thoracic aortic aneurysm, without rupture, unspecified: Secondary | ICD-10-CM

## 2020-05-07 NOTE — Progress Notes (Unsigned)
Ct

## 2020-06-09 ENCOUNTER — Ambulatory Visit: Payer: Medicare Other | Admitting: Cardiothoracic Surgery

## 2020-06-09 ENCOUNTER — Other Ambulatory Visit: Payer: Self-pay

## 2020-06-09 ENCOUNTER — Encounter: Payer: Self-pay | Admitting: Cardiothoracic Surgery

## 2020-06-09 ENCOUNTER — Telehealth: Payer: Self-pay | Admitting: Orthopaedic Surgery

## 2020-06-09 ENCOUNTER — Ambulatory Visit
Admission: RE | Admit: 2020-06-09 | Discharge: 2020-06-09 | Disposition: A | Discharge status: Home / Self Care | Payer: Medicare Other | Source: Ambulatory Visit | Attending: Cardiothoracic Surgery | Admitting: Cardiothoracic Surgery

## 2020-06-09 VITALS — BP 140/80 | HR 70 | Temp 97.9°F | Resp 20 | Ht 64.0 in | Wt 136.0 lb

## 2020-06-09 DIAGNOSIS — M314 Aortic arch syndrome [Takayasu]: Secondary | ICD-10-CM

## 2020-06-09 DIAGNOSIS — I712 Thoracic aortic aneurysm, without rupture, unspecified: Secondary | ICD-10-CM

## 2020-06-09 MED ORDER — IOPAMIDOL (ISOVUE-370) INJECTION 76%
75.0000 mL | Freq: Once | INTRAVENOUS | Status: AC | PRN
  Administered 2020-06-09: 75 mL via INTRAVENOUS

## 2020-06-09 NOTE — Progress Notes (Signed)
PCP is Elias Else, MD Referring Provider is Elias Else, MD  Chief Complaint  Patient presents with  . Thoracic Aortic Aneurysm    f/u with CTA Chest    HPI: Annual office visit with CTA to follow a mild-moderate fusiform ascending thoracic aortic aneurysm.  It measures 4.5 cm and has been fairly stable since 2015.  It is asymptomatic.  The patient has hypertension but is compliant with his medications and the blood pressure is very well controlled.  Echocardiogram last year showed mild-moderate AI, asymptomatic.  He is a very active 86 year old male who follows a heart healthy lifestyle.  The images of the CT scan today which show no change in the ascending aortic diameter 4.5 cm.  There is no mural thrombus or ulceration of the aortic wall.  Lung windows appear to be clear without suspicious mediastinal adenopathy.  Past Medical History:  Diagnosis Date  . Aortic stenosis, moderate   . Arrhythmia   . BPH (benign prostatic hyperplasia)    Dr. Mena Goes, UROLOGY  . Echocardiogram abnormal 04/03/2017   MILD TO MOD MR, MOD AV REGURG, MILD STENOSIS  . GERD (gastroesophageal reflux disease)   . Glaucoma    followed by Dr. Theodis Aguas, [facility]  . Heart murmur   . Hypercholesteremia    PURE  . Hypertension   . Thoracic aortic aneurysm without rupture [facility])     Past Surgical History:  Procedure Laterality Date  . APPENDECTOMY    . EYE SURGERY    . I & D EXTREMITY Left 03/02/2014   Procedure: Removal of Left foot mass;  Surgeon: Cheral Almas, MD;  Location: [facility] OR;  Service: Orthopedics;  Laterality: Left;    History reviewed. No pertinent family history.  Social History Social History   Tobacco Use  . Smoking status: Never Smoker  . Smokeless tobacco: Never Used  Substance Use Topics  . Alcohol use: No  . Drug use: No    Current Outpatient Medications  Medication Sig Dispense Refill  . amLODipine (NORVASC) 5 MG tablet Take 5 mg by mouth daily.    .  finasteride (PROSCAR) 5 MG tablet Take 5 mg by mouth daily.    . iron polysaccharides (NU-IRON) 150 MG capsule Take 150 mg by mouth daily.    Marland Kitchen latanoprost (XALATAN) 0.005 % ophthalmic solution     . lisinopril (PRINIVIL,ZESTRIL) 20 MG tablet Take 20 mg by mouth daily.    . meloxicam (MOBIC) 15 MG tablet Take 15 mg by mouth daily.    . metoprolol succinate (TOPROL-XL) 25 MG 24 hr tablet Take 25 mg by mouth daily.    . mupirocin ointment (BACTROBAN) 2 % Apply to affected area 2 times daily as needed 22 g 0  . neomycin-polymyxin-hydrocortisone (CORTISPORIN) OTIC solution Apply 1-2 drops to toes twice a day 10 mL 0  . ranitidine (ZANTAC) 300 MG tablet Take 300 mg by mouth at bedtime.    . travoprost, benzalkonium, (TRAVATAN) 0.004 % ophthalmic solution 1 drop at bedtime.    . triamcinolone cream (KENALOG) 0.1 % Apply 1 application topically 2 (two) times daily as needed.     No current facility-administered medications for this visit.    No Known Allergies  Review of Systems              Mild arthritis         No hospitalizations in the last year       Received full Covid vaccination  Review of Systems :  [ y ] = yes, [  ] = no        General :  Weight gain [   ]    Weight loss  [   ]  Fatigue [  ]  Fever [  ]  Chills  [  ]                                          HEENT    Headache [  ]  Dizziness [  ]  Blurred vision [  ] Glaucoma  [  ]                          Nosebleeds [  ] Painful or loose teeth [  ]        Cardiac :  Chest pain/ pressure [  ]  Resting SOB [  ] exertional SOB [  ]                        Orthopnea [  ]  Pedal edema  [  ]  Palpitations [  ] Syncope/presyncope [ ]                         Paroxysmal nocturnal dyspnea [  ]         Pulmonary : cough [  ]  wheezing [  ]  Hemoptysis [  ] Sputum [  ] Snoring [  ]                              Pneumothorax [  ]  Sleep apnea [  ]        GI : Vomiting [  ]  Dysphagia [  ]  Melena  [  ]  Abdominal pain [  ] BRBPR [  ]               Heart burn [  ]  Constipation [  ] Diarrhea  [  ] Colonoscopy [   ]        GU : Hematuria [  ]  Dysuria [  ]  Nocturia [  ] UTI's [  ]        Vascular : Claudication [  ]  Rest pain [  ]  DVT [  ] Vein stripping [  ] leg ulcers [  ]                          TIA [  ] Stroke [  ]  Varicose veins [  ]        NEURO :  Headaches  [  ] Seizures [  ] Vision changes [  ] Paresthesias [  ]                                               Musculoskeletal :  Arthritis [  ] Gout  [  ]  Back pain [  ]  Joint pain [  ]  Skin :  Rash [  ]  Melanoma [  ] Sores [  ]        Heme : Bleeding problems [  ]Clotting Disorders [  ] Anemia [  ]Blood Transfusion [ ]         Endocrine : Diabetes [  ] Heat or Cold intolerance [  ] Polyuria [  ]excessive thirst [ ]         Psych : Depression [  ]  Anxiety [  ]  Psych hospitalizations [  ] Memory change [  ]                                                                            BP 140/80   Pulse 70   Temp 97.9 F (36.6 C) (Skin)   Resp 20   Ht 5\' 4"  (1.626 m)   Wt 136 lb (61.7 kg)   SpO2 93% Comment: RA  BMI 23.34 kg/m  Physical Exam      Exam    General- alert and comfortable    Neck- no JVD, no cervical adenopathy palpable, no carotid bruit   Lungs- clear without rales, wheezes   Cor- regular rate and rhythm, 2/6 As murmur , gallop   Abdomen- soft, non-tender   Extremities - warm, non-tender, minimal edema   Neuro- oriented, appropriate, no focal weakness   Diagnostic Tests: CTA images show mild-moderate ascending aortic dilatation as noted above Impression: Stable ascending fusiform aneurysm. Well-controlled hypertension Very low risk for aortic tear-dissection, less than 2%  Plan: Turn with repeat CTA in 18 months.  , MD [facility] [phone]

## 2020-06-09 NOTE — Telephone Encounter (Signed)
Patient stopped   He is requesting we call his pharmacy so they can refill his Bactroban.   Call back: [phone]

## 2020-06-25 ENCOUNTER — Telehealth: Payer: Self-pay | Admitting: Orthopaedic Surgery

## 2020-06-25 NOTE — Telephone Encounter (Signed)
Patient stopped by  He is requesting we call his pharmacy so they can refill his Mupirocin   Call back: [phone]

## 2020-06-27 MED ORDER — MUPIROCIN 2 % EX OINT: 1.0000 "application " | TOPICAL_OINTMENT | Freq: Two times a day (BID) | CUTANEOUS | 0 refills | Status: DC

## 2020-06-27 NOTE — Addendum Note (Signed)
Addended by: Mayra Reel on: 06/27/2020 09:24 AM   Modules accepted: Orders

## 2020-12-07 DIAGNOSIS — K219 Gastro-esophageal reflux disease without esophagitis: Secondary | ICD-10-CM | POA: Diagnosis not present

## 2020-12-07 DIAGNOSIS — L309 Dermatitis, unspecified: Secondary | ICD-10-CM | POA: Diagnosis not present

## 2020-12-07 DIAGNOSIS — M898X6 Other specified disorders of bone, lower leg: Secondary | ICD-10-CM | POA: Diagnosis not present

## 2020-12-07 DIAGNOSIS — Z0000 Encounter for general adult medical examination without abnormal findings: Secondary | ICD-10-CM | POA: Diagnosis not present

## 2020-12-07 DIAGNOSIS — I712 Thoracic aortic aneurysm, without rupture: Secondary | ICD-10-CM | POA: Diagnosis not present

## 2020-12-07 DIAGNOSIS — E7800 Pure hypercholesterolemia, unspecified: Secondary | ICD-10-CM | POA: Diagnosis not present

## 2020-12-07 DIAGNOSIS — I350 Nonrheumatic aortic (valve) stenosis: Secondary | ICD-10-CM | POA: Diagnosis not present

## 2020-12-07 DIAGNOSIS — I10 Essential (primary) hypertension: Secondary | ICD-10-CM | POA: Diagnosis not present

## 2020-12-07 DIAGNOSIS — Z1389 Encounter for screening for other disorder: Secondary | ICD-10-CM | POA: Diagnosis not present

## 2020-12-07 DIAGNOSIS — D509 Iron deficiency anemia, unspecified: Secondary | ICD-10-CM | POA: Diagnosis not present

## 2020-12-07 DIAGNOSIS — E46 Unspecified protein-calorie malnutrition: Secondary | ICD-10-CM | POA: Diagnosis not present

## 2021-02-15 ENCOUNTER — Ambulatory Visit: Payer: Self-pay

## 2021-02-15 ENCOUNTER — Ambulatory Visit: Payer: Medicare Other | Admitting: Orthopaedic Surgery

## 2021-02-15 ENCOUNTER — Encounter: Payer: Self-pay | Admitting: Orthopaedic Surgery

## 2021-02-15 DIAGNOSIS — M25561 Pain in right knee: Secondary | ICD-10-CM

## 2021-02-15 DIAGNOSIS — R2242 Localized swelling, mass and lump, left lower limb: Secondary | ICD-10-CM

## 2021-02-15 DIAGNOSIS — G8929 Other chronic pain: Secondary | ICD-10-CM | POA: Diagnosis not present

## 2021-02-15 MED ORDER — MUPIROCIN 2 % EX OINT: 1.0000 "application " | TOPICAL_OINTMENT | Freq: Two times a day (BID) | CUTANEOUS | 12 refills | Status: DC

## 2021-02-15 MED ORDER — METHYLPREDNISOLONE ACETATE 40 MG/ML IJ SUSP
40.0000 mg | INTRAMUSCULAR | Status: AC | PRN
Start: 2021-02-15 — End: 2021-02-15
  Administered 2021-02-15: 40 mg via INTRA_ARTICULAR

## 2021-02-15 MED ORDER — LIDOCAINE HCL 1 % IJ SOLN
2.0000 mL | INTRAMUSCULAR | Status: AC | PRN
  Administered 2021-02-15: 2 mL

## 2021-02-15 MED ORDER — BUPIVACAINE HCL 0.5 % IJ SOLN
2.0000 mL | INTRAMUSCULAR | Status: AC | PRN
  Administered 2021-02-15: 2 mL via INTRA_ARTICULAR

## 2021-02-15 NOTE — Progress Notes (Signed)
Office Visit Note   Patient: Jorge Long           Date of Birth: August 01, 1933           MRN: [id number] Visit Date: 02/15/2021              Requested by: Jorge Else, MD [phone] [address] [place],  [place] [address] PCP: Jorge Else, MD   Assessment & Plan: Visit Diagnoses:  1. Chronic pain of right knee   2. Mass of left foot     Plan: Mupirocin refilled for the left foot for another year and right knee was injected with cortisone today after consideration of treatment options.  He had such good relief from previous cortisone injection.  We will see him back as needed.  Follow-Up Instructions: Return if symptoms worsen or fail to improve.   Orders:  Orders Placed This Encounter  Procedures  . XR KNEE 3 VIEW RIGHT   Meds ordered this encounter  Medications  . mupirocin ointment (BACTROBAN) 2 %    Sig: Apply 1 application topically 2 (two) times daily.    Dispense:  30 g    Refill:  12      Procedures: Large Joint Inj: R knee on 02/15/2021 11:20 AM Indications: pain Details: 22 G needle  Arthrogram: No  Medications: 40 mg methylPREDNISolone acetate 40 MG/ML; 2 mL lidocaine 1 %; 2 mL bupivacaine 0.5 % Consent was given by the patient. Patient was prepped and draped in the usual sterile fashion.       Clinical Data: No additional findings.   Subjective: Chief Complaint  Patient presents with  . Left Foot - Pain  . Right Knee - Pain    Jorge Long is a 87 year old gentleman who comes in for follow-up of left foot mass and callus as well as right knee pain due to DJD.  He has had a prior cortisone injection in the right knee which helped for many years.  He would like a refill of his mupirocin for the left foot.   Review of Systems  Constitutional: Negative.   All other systems reviewed and are negative.    Objective: Vital Signs: There were no vitals taken for this visit.  Physical Exam Vitals and nursing note reviewed.   Constitutional:      Appearance: He is well-developed.  Pulmonary:     Effort: Pulmonary effort is normal.  Abdominal:     Palpations: Abdomen is soft.  Skin:    General: Skin is warm.  Neurological:     Mental Status: He is alert and oriented to person, place, and time.  Psychiatric:        Behavior: Behavior normal.        Thought Content: Thought content normal.        Judgment: Judgment normal.     Ortho Exam Right knee shows no joint effusion.  1+ patellofemoral crepitus with range of motion.  Collaterals and cruciates are stable.  Moderate pain with range of motion.  Left foot shows a callused area at the base of the fifth metatarsal.  No skin compromise. Specialty Comments:  No specialty comments available.  Imaging: XR KNEE 3 VIEW RIGHT  Result Date: 02/15/2021 Severe tricompartmental DJD    PMFS History: Patient Active Problem List   Diagnosis Date Noted  . Idiopathic medial aortopathy and arteriopathy (HCC) 06/09/2020  . GERD (gastroesophageal reflux disease)   . Hypertension   . Thoracic aortic aneurysm without rupture (HCC)   .  Glaucoma   . Hypercholesteremia   . Aortic stenosis, moderate   . BPH (benign prostatic hyperplasia)   . Echocardiogram abnormal 04/03/2017  . Mass of left foot 02/26/2014   Past Medical History:  Diagnosis Date  . Aortic stenosis, moderate   . Arrhythmia   . BPH (benign prostatic hyperplasia)    Dr. Mena Long, UROLOGY  . Echocardiogram abnormal 04/03/2017   MILD TO MOD MR, MOD AV REGURG, MILD STENOSIS  . GERD (gastroesophageal reflux disease)   . Glaucoma    followed by Dr. Theodis Long, [facility]  . Heart murmur   . Hypercholesteremia    PURE  . Hypertension   . Thoracic aortic aneurysm without rupture (HCC)     History reviewed. No pertinent family history.  Past Surgical History:  Procedure Laterality Date  . APPENDECTOMY    . EYE SURGERY    . I & D EXTREMITY Left 03/02/2014   Procedure: Removal of Left  foot mass;  Surgeon: Jorge Almas, MD;  Location: [facility] OR;  Service: Orthopedics;  Laterality: Left;   Social History   Occupational History  . Not on file  Tobacco Use  . Smoking status: Never Smoker  . Smokeless tobacco: Never Used  Substance and Sexual Activity  . Alcohol use: No  . Drug use: No  . Sexual activity: Not on file

## 2021-03-08 DIAGNOSIS — H401132 Primary open-angle glaucoma, bilateral, moderate stage: Secondary | ICD-10-CM | POA: Diagnosis not present

## 2021-03-28 IMAGING — MR MR FOOT*L* WO/W CM
9 series · 40 of 40 positions shown · IV contrast (gadavist)
Comparison: MRI left foot 02/27/2014.  Plain films left foot today.

CLINICAL DATA: Chronic/recurrent infection along the lateral aspect
of the left foot. Pain.

EXAM:
MRI OF THE LEFT FOREFOOT WITHOUT AND WITH CONTRAST
TECHNIQUE: Multiplanar, multisequence MR imaging of the left forefoot was
performed both before and after administration of intravenous
contrast.
CONTRAST:  6 mL GADAVIST IV

[Series 4: T1 · coronal · left · 3.0mm · 0.51mm/px · 7 of 66 slices shown (1 of 2)]
[im 1/66]
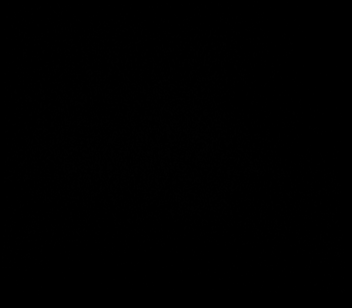
[im 11/66]
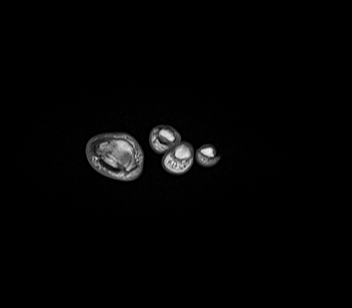
[im 22/66]
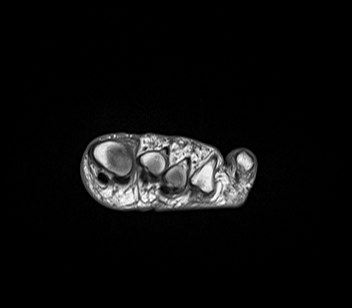
[im 33/66]
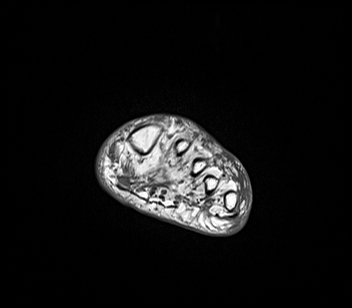
[im 44/66]
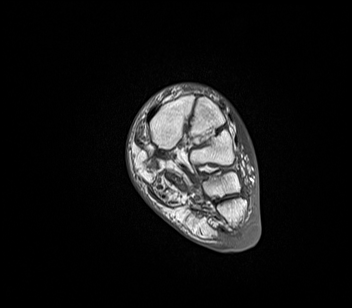
[im 55/66]
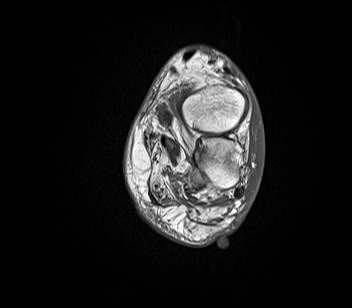
[im 66/66]
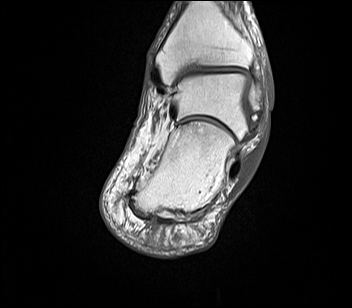

[Series 5: T2 fat-sat · coronal · left · 3.0mm · 0.49mm/px · 6 of 66 slices shown (1 of 2)]
[im 1/66]
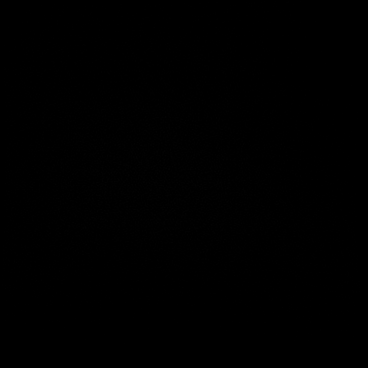
[im 14/66]
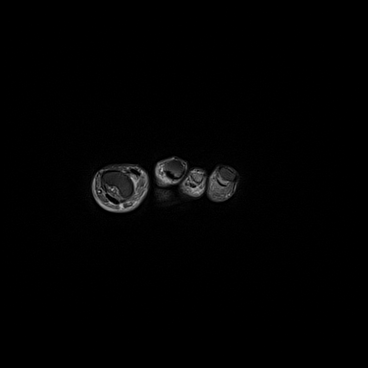
[im 27/66]
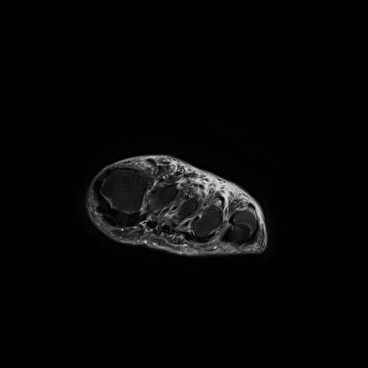
[im 40/66]
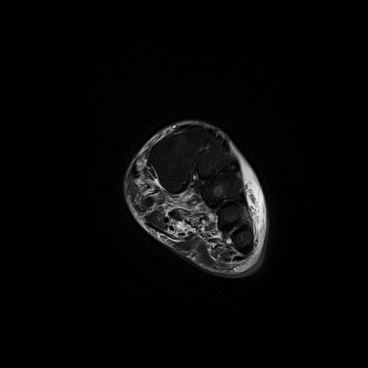
[im 53/66]
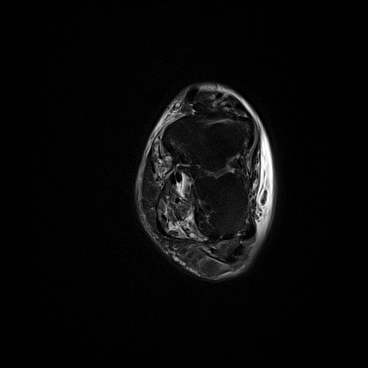
[im 66/66]
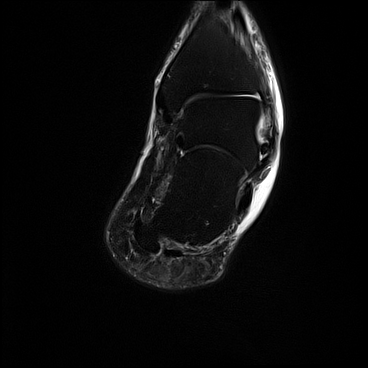

[Series 6: T1 · oblique · left · 3.0mm · 0.60mm/px · 3 of 36 slices shown (2 of 2)]
[im 1/36]
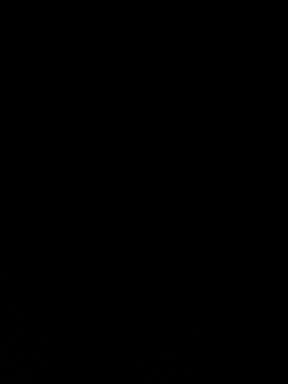
[im 18/36]
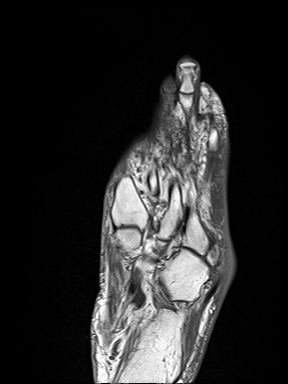
[im 36/36]
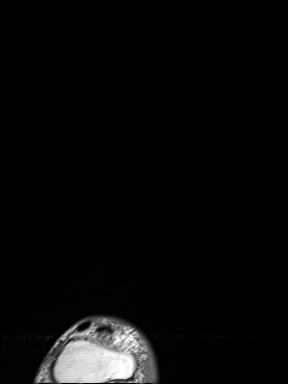

[Series 7: T2 fat-sat · oblique · left · 3.0mm · 0.68mm/px · 3 of 36 slices shown (2 of 2)]
[im 1/36]
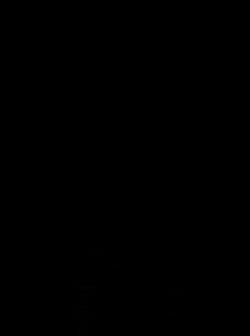
[im 18/36]
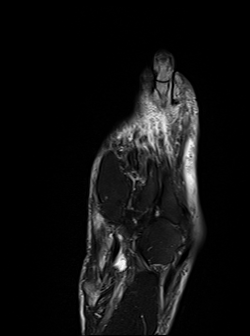
[im 36/36]
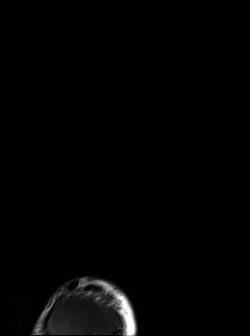

[Series 8: STIR · sagittal · left · 3.0mm · 0.90mm/px · 3 of 34 slices shown]
[im 1/34]
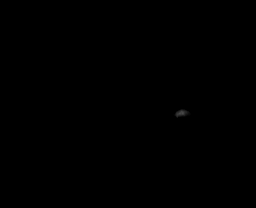
[im 17/34]
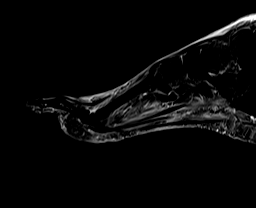
[im 34/34]
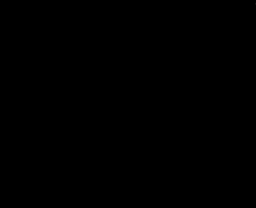

[Series 9: T1 fat-sat · coronal · non-contrast · left · 3.0mm · 0.51mm/px · 6 of 66 slices shown]
[im 1/66]
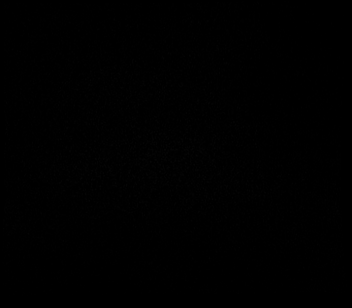
[im 14/66]
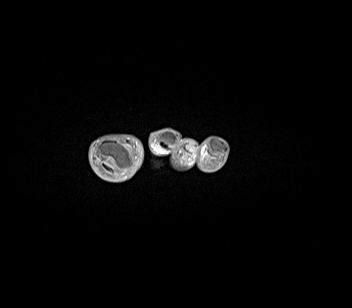
[im 27/66]
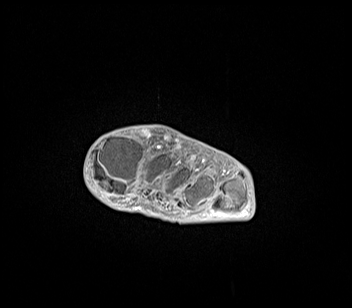
[im 40/66]
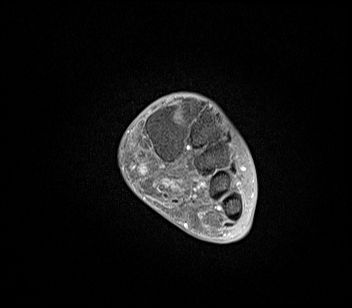
[im 53/66]
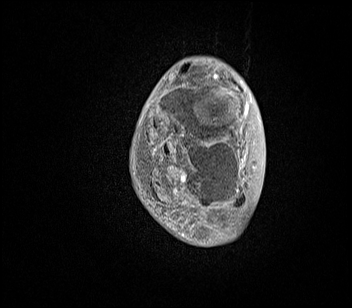
[im 66/66]
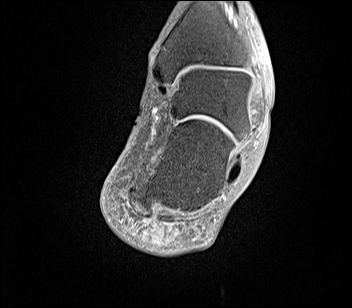

[Series 10: T1 fat-sat post-contrast · coronal · left · 3.0mm · 0.51mm/px · 6 of 66 slices shown (1 of 3)]
[im 1/66]
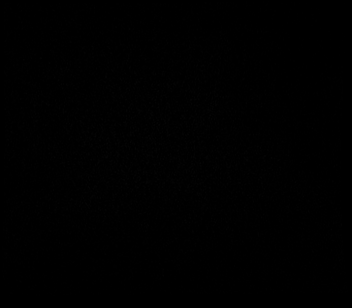
[im 14/66]
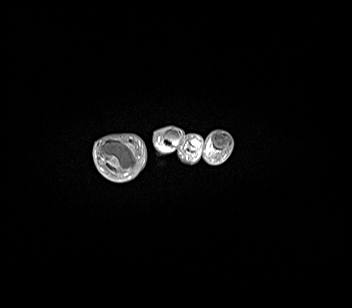
[im 27/66]
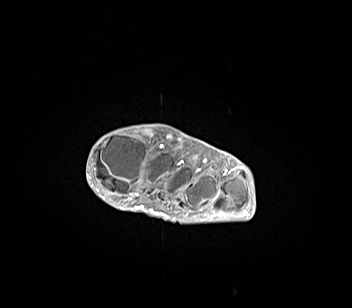
[im 40/66]
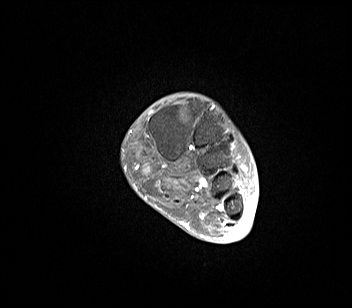
[im 53/66]
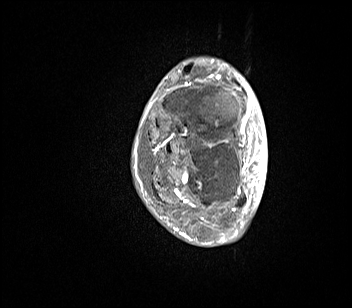
[im 66/66]
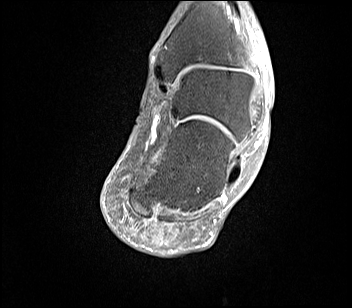

[Series 11: T1 fat-sat post-contrast · oblique · left · 3.0mm · 0.60mm/px · 3 of 36 slices shown (2 of 3)]
[im 1/36]
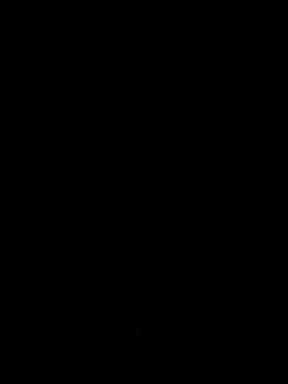
[im 18/36]
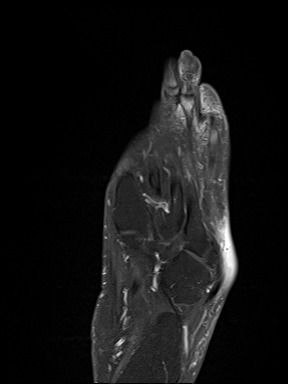
[im 36/36]
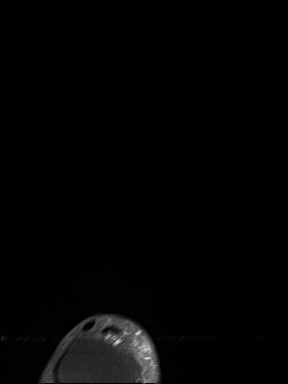

[Series 12: T1 fat-sat post-contrast · sagittal · left · 3.0mm · 0.76mm/px · 3 of 38 slices shown (3 of 3)]
[im 1/38]
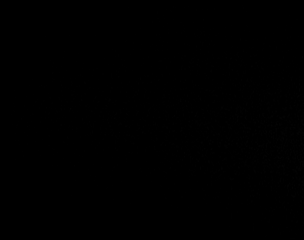
[im 19/38]
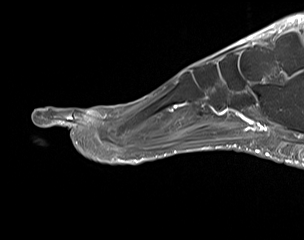
[im 38/38]
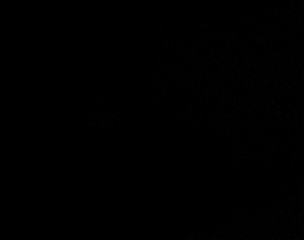

[40 of 40 positions shown; findings below may reference images not displayed]

FINDINGS: Bones/Joint/Cartilage

There is no marrow edema or enhancement to suggest osteomyelitis. No
fracture, stress change or worrisome lesion is identified. No joint
effusion. Mild midfoot osteoarthritis is noted.

Ligaments

Intact.

Muscles and Tendons

Intact. No intramuscular fluid collection. There is atrophy of
intrinsic musculature the foot.

Soft tissues

Lateral to the base of the fifth metatarsal, there is a superficial
fluid collection measuring 2.2 cm long by 0.7 cm transverse by
cm craniocaudal. Soft tissue enhancement is seen deep to the
collection there is no enhancement along the lateral periphery of
the collection. Subcutaneous edema about the dorsum of the foot is
noted.
IMPRESSION: Superficial fluid collection lateral to the base of the fifth
metatarsal could be due to a blister or abscess. Underlying soft
tissue edema is compatible with granulation tissue or cellulitis.

Negative for septic joint or osteomyelitis.

## 2021-05-10 DIAGNOSIS — K219 Gastro-esophageal reflux disease without esophagitis: Secondary | ICD-10-CM | POA: Diagnosis not present

## 2021-05-10 DIAGNOSIS — D509 Iron deficiency anemia, unspecified: Secondary | ICD-10-CM | POA: Diagnosis not present

## 2021-05-10 DIAGNOSIS — E7800 Pure hypercholesterolemia, unspecified: Secondary | ICD-10-CM | POA: Diagnosis not present

## 2021-05-10 DIAGNOSIS — N181 Chronic kidney disease, stage 1: Secondary | ICD-10-CM | POA: Diagnosis not present

## 2021-05-10 DIAGNOSIS — I10 Essential (primary) hypertension: Secondary | ICD-10-CM | POA: Diagnosis not present

## 2021-05-10 DIAGNOSIS — H40019 Open angle with borderline findings, low risk, unspecified eye: Secondary | ICD-10-CM | POA: Diagnosis not present

## 2021-06-08 DIAGNOSIS — L309 Dermatitis, unspecified: Secondary | ICD-10-CM | POA: Diagnosis not present

## 2021-06-08 DIAGNOSIS — K219 Gastro-esophageal reflux disease without esophagitis: Secondary | ICD-10-CM | POA: Diagnosis not present

## 2021-06-08 DIAGNOSIS — H40019 Open angle with borderline findings, low risk, unspecified eye: Secondary | ICD-10-CM | POA: Diagnosis not present

## 2021-06-08 DIAGNOSIS — Z85820 Personal history of malignant melanoma of skin: Secondary | ICD-10-CM | POA: Diagnosis not present

## 2021-06-08 DIAGNOSIS — E7800 Pure hypercholesterolemia, unspecified: Secondary | ICD-10-CM | POA: Diagnosis not present

## 2021-06-08 DIAGNOSIS — I350 Nonrheumatic aortic (valve) stenosis: Secondary | ICD-10-CM | POA: Diagnosis not present

## 2021-06-08 DIAGNOSIS — D509 Iron deficiency anemia, unspecified: Secondary | ICD-10-CM | POA: Diagnosis not present

## 2021-06-08 DIAGNOSIS — I712 Thoracic aortic aneurysm, without rupture: Secondary | ICD-10-CM | POA: Diagnosis not present

## 2021-06-08 DIAGNOSIS — I10 Essential (primary) hypertension: Secondary | ICD-10-CM | POA: Diagnosis not present

## 2021-06-08 DIAGNOSIS — M898X6 Other specified disorders of bone, lower leg: Secondary | ICD-10-CM | POA: Diagnosis not present

## 2021-06-08 DIAGNOSIS — E46 Unspecified protein-calorie malnutrition: Secondary | ICD-10-CM | POA: Diagnosis not present

## 2021-06-24 DIAGNOSIS — I10 Essential (primary) hypertension: Secondary | ICD-10-CM | POA: Diagnosis not present

## 2021-06-24 DIAGNOSIS — H40019 Open angle with borderline findings, low risk, unspecified eye: Secondary | ICD-10-CM | POA: Diagnosis not present

## 2021-06-24 DIAGNOSIS — D509 Iron deficiency anemia, unspecified: Secondary | ICD-10-CM | POA: Diagnosis not present

## 2021-06-24 DIAGNOSIS — N181 Chronic kidney disease, stage 1: Secondary | ICD-10-CM | POA: Diagnosis not present

## 2021-06-24 DIAGNOSIS — K219 Gastro-esophageal reflux disease without esophagitis: Secondary | ICD-10-CM | POA: Diagnosis not present

## 2021-06-24 DIAGNOSIS — E7800 Pure hypercholesterolemia, unspecified: Secondary | ICD-10-CM | POA: Diagnosis not present

## 2021-07-20 DIAGNOSIS — N181 Chronic kidney disease, stage 1: Secondary | ICD-10-CM | POA: Diagnosis not present

## 2021-07-20 DIAGNOSIS — D509 Iron deficiency anemia, unspecified: Secondary | ICD-10-CM | POA: Diagnosis not present

## 2021-07-20 DIAGNOSIS — K219 Gastro-esophageal reflux disease without esophagitis: Secondary | ICD-10-CM | POA: Diagnosis not present

## 2021-07-20 DIAGNOSIS — H40019 Open angle with borderline findings, low risk, unspecified eye: Secondary | ICD-10-CM | POA: Diagnosis not present

## 2021-07-20 DIAGNOSIS — I10 Essential (primary) hypertension: Secondary | ICD-10-CM | POA: Diagnosis not present

## 2021-07-20 DIAGNOSIS — E7800 Pure hypercholesterolemia, unspecified: Secondary | ICD-10-CM | POA: Diagnosis not present

## 2021-08-11 DIAGNOSIS — I10 Essential (primary) hypertension: Secondary | ICD-10-CM | POA: Diagnosis not present

## 2021-08-11 DIAGNOSIS — K219 Gastro-esophageal reflux disease without esophagitis: Secondary | ICD-10-CM | POA: Diagnosis not present

## 2021-08-11 DIAGNOSIS — H40019 Open angle with borderline findings, low risk, unspecified eye: Secondary | ICD-10-CM | POA: Diagnosis not present

## 2021-08-11 DIAGNOSIS — N181 Chronic kidney disease, stage 1: Secondary | ICD-10-CM | POA: Diagnosis not present

## 2021-08-11 DIAGNOSIS — D509 Iron deficiency anemia, unspecified: Secondary | ICD-10-CM | POA: Diagnosis not present

## 2021-08-11 DIAGNOSIS — E7800 Pure hypercholesterolemia, unspecified: Secondary | ICD-10-CM | POA: Diagnosis not present

## 2021-09-05 DIAGNOSIS — H401132 Primary open-angle glaucoma, bilateral, moderate stage: Secondary | ICD-10-CM | POA: Diagnosis not present

## 2021-09-07 DIAGNOSIS — K219 Gastro-esophageal reflux disease without esophagitis: Secondary | ICD-10-CM | POA: Diagnosis not present

## 2021-09-07 DIAGNOSIS — E7800 Pure hypercholesterolemia, unspecified: Secondary | ICD-10-CM | POA: Diagnosis not present

## 2021-09-07 DIAGNOSIS — N181 Chronic kidney disease, stage 1: Secondary | ICD-10-CM | POA: Diagnosis not present

## 2021-09-07 DIAGNOSIS — D509 Iron deficiency anemia, unspecified: Secondary | ICD-10-CM | POA: Diagnosis not present

## 2021-09-07 DIAGNOSIS — I10 Essential (primary) hypertension: Secondary | ICD-10-CM | POA: Diagnosis not present

## 2021-11-23 ENCOUNTER — Other Ambulatory Visit: Payer: Self-pay | Admitting: *Deleted

## 2021-11-23 DIAGNOSIS — I712 Thoracic aortic aneurysm, without rupture, unspecified: Secondary | ICD-10-CM

## 2021-12-12 ENCOUNTER — Ambulatory Visit: Payer: Medicare Other | Admitting: Cardiothoracic Surgery

## 2021-12-19 ENCOUNTER — Ambulatory Visit: Payer: Medicare Other | Admitting: Cardiothoracic Surgery

## 2021-12-19 ENCOUNTER — Other Ambulatory Visit: Payer: Medicare Other

## 2021-12-19 DIAGNOSIS — K120 Recurrent oral aphthae: Secondary | ICD-10-CM | POA: Diagnosis not present

## 2021-12-19 DIAGNOSIS — E7800 Pure hypercholesterolemia, unspecified: Secondary | ICD-10-CM | POA: Diagnosis not present

## 2021-12-19 DIAGNOSIS — Z0000 Encounter for general adult medical examination without abnormal findings: Secondary | ICD-10-CM | POA: Diagnosis not present

## 2021-12-19 DIAGNOSIS — I350 Nonrheumatic aortic (valve) stenosis: Secondary | ICD-10-CM | POA: Diagnosis not present

## 2021-12-19 DIAGNOSIS — D509 Iron deficiency anemia, unspecified: Secondary | ICD-10-CM | POA: Diagnosis not present

## 2021-12-19 DIAGNOSIS — L309 Dermatitis, unspecified: Secondary | ICD-10-CM | POA: Diagnosis not present

## 2021-12-19 DIAGNOSIS — K219 Gastro-esophageal reflux disease without esophagitis: Secondary | ICD-10-CM | POA: Diagnosis not present

## 2021-12-19 DIAGNOSIS — I712 Thoracic aortic aneurysm, without rupture, unspecified: Secondary | ICD-10-CM | POA: Diagnosis not present

## 2021-12-19 DIAGNOSIS — H40019 Open angle with borderline findings, low risk, unspecified eye: Secondary | ICD-10-CM | POA: Diagnosis not present

## 2021-12-19 DIAGNOSIS — Z1389 Encounter for screening for other disorder: Secondary | ICD-10-CM | POA: Diagnosis not present

## 2021-12-19 DIAGNOSIS — I10 Essential (primary) hypertension: Secondary | ICD-10-CM | POA: Diagnosis not present

## 2021-12-26 ENCOUNTER — Other Ambulatory Visit: Payer: Self-pay

## 2021-12-26 ENCOUNTER — Ambulatory Visit: Payer: Medicare Other | Admitting: Cardiothoracic Surgery

## 2021-12-26 ENCOUNTER — Ambulatory Visit
Admission: RE | Admit: 2021-12-26 | Discharge: 2021-12-26 | Disposition: A | Discharge status: Home / Self Care | Payer: Medicare Other | Source: Ambulatory Visit | Attending: Cardiothoracic Surgery | Admitting: Cardiothoracic Surgery

## 2021-12-26 ENCOUNTER — Encounter: Payer: Self-pay | Admitting: Cardiothoracic Surgery

## 2021-12-26 VITALS — BP 152/72 | HR 58 | Resp 20 | Ht 64.0 in | Wt 135.8 lb

## 2021-12-26 DIAGNOSIS — I7121 Aneurysm of the ascending aorta, without rupture: Secondary | ICD-10-CM | POA: Diagnosis not present

## 2021-12-26 DIAGNOSIS — I712 Thoracic aortic aneurysm, without rupture, unspecified: Secondary | ICD-10-CM | POA: Diagnosis not present

## 2021-12-26 DIAGNOSIS — I2510 Atherosclerotic heart disease of native coronary artery without angina pectoris: Secondary | ICD-10-CM | POA: Diagnosis not present

## 2021-12-26 MED ORDER — IOPAMIDOL (ISOVUE-300) INJECTION 61%
75.0000 mL | Freq: Once | INTRAVENOUS | Status: AC | PRN
  Administered 2021-12-26: 75 mL via INTRAVENOUS

## 2021-12-26 NOTE — Progress Notes (Signed)
PCP is Elias Else, MD Referring Provider is Elias Else, MD  Chief Complaint  Patient presents with   Thoracic Aortic Aneurysm    18 month f/u with CTA chest    HPI: Very nice 88 year old hypertensive male non-smoker presents for annual CTA of thoracic aorta for known asymptomatic aneurysm.  This has been followed since 2015.  2021 was measured at 4.5 cm, asymptomatic.  Today I personally reviewed the images of his CT scan and the diameter of the ascending aorta is now 4.8 cm.  There is no intramural hematoma or ulceration.  Slight increase in diameter does not change the very low risk of tear or aortic dissection still remains less than 4%.  He denies any symptoms of chest pain.  He has known asymptomatic mild-moderate aortic stenosis.  Today's blood pressure was 152 systolic.  He checks his blood pressure regularly and is followed closely by Dr. Nicholos Johns and his current medications are Norvasc, metoprolol, lisinopril.  He last checked in with Dr. Nicholos Johns about a week ago and had his blood pressure checked.  He understands the goal is to keep his systolic blood pressure less than 140.   Past Medical History:  Diagnosis Date   Aortic stenosis, moderate    Arrhythmia    BPH (benign prostatic hyperplasia)    Dr. Mena Goes, UROLOGY   Echocardiogram abnormal 04/03/2017   MILD TO MOD MR, MOD AV REGURG, MILD STENOSIS   GERD (gastroesophageal reflux disease)    Glaucoma    followed by Dr. Theodis Aguas, [facility]   Heart murmur    Hypercholesteremia    PURE   Hypertension    Thoracic aortic aneurysm without rupture     Past Surgical History:  Procedure Laterality Date   APPENDECTOMY     EYE SURGERY     I & D EXTREMITY Left 03/02/2014   Procedure: Removal of Left foot mass;  Surgeon: Cheral Almas, MD;  Location: [facility] OR;  Service: Orthopedics;  Laterality: Left;    History reviewed. No pertinent family history.  Social History Social History   Tobacco Use   Smoking status:  Never   Smokeless tobacco: Never  Substance Use Topics   Alcohol use: No   Drug use: No    Current Outpatient Medications  Medication Sig Dispense Refill   amLODipine (NORVASC) 5 MG tablet Take 5 mg by mouth daily.     finasteride (PROSCAR) 5 MG tablet Take 5 mg by mouth daily.     iron polysaccharides (NIFEREX) 150 MG capsule Take 150 mg by mouth daily.     latanoprost (XALATAN) 0.005 % ophthalmic solution      lisinopril (PRINIVIL,ZESTRIL) 20 MG tablet Take 20 mg by mouth daily.     meloxicam (MOBIC) 15 MG tablet Take 15 mg by mouth daily.     metoprolol succinate (TOPROL-XL) 25 MG 24 hr tablet Take 25 mg by mouth daily.     mupirocin ointment (BACTROBAN) 2 % Apply 1 application topically 2 (two) times daily. 30 g 12   neomycin-polymyxin-hydrocortisone (CORTISPORIN) OTIC solution Apply 1-2 drops to toes twice a day 10 mL 0   ranitidine (ZANTAC) 300 MG tablet Take 300 mg by mouth at bedtime.     travoprost, benzalkonium, (TRAVATAN) 0.004 % ophthalmic solution 1 drop at bedtime.     triamcinolone cream (KENALOG) 0.1 % Apply 1 application topically 2 (two) times daily as needed.     No current facility-administered medications for this visit.    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 He had a vascular ultrasound study of his lower extremity vessels and was told he had good flow.   BP (!) 152/72 (BP Location: Left Arm, Patient Position: Sitting, Cuff Size: Normal)    Pulse (!) 58    Resp 20    Ht 5\' 4"  (1.626 m)    Wt 135 lb 12.8 oz (61.6 kg)    SpO2 98% Comment: RA   BMI 23.31 kg/m  Physical Exam      Exam    General- alert and comfortable    Neck- no JVD, no cervical adenopathy palpable, no carotid bruit   Lungs- clear without rales, wheezes   Cor- regular rate and rhythm,  2/6 aortic stenosis murmur ,  no  gallop   Abdomen- soft, non-tender   Extremities - warm, non-tender, minimal edema   Neuro- oriented, appropriate, no focal weakness   Diagnostic Tests: CTA images personally reviewed in the ascending aorta measures and 4.8 cm diameter. No concerning mediastinal adenopathy pulmonary nodules  Impression: Stable moderate fusiform ascending thoracic aneurysm.  Current diameter measured 4.8 cm.  Risk for tear or dissection still remains less than 3 to 4%.  Best therapy is good blood pressure control as he is not a candidate for aortic reconstructive surgery at age 71 Plan: Return for surveillance CT scan in a year. Continue to monitor blood pressure carefully continue good compliance with medication for blood pressure  97, MD [facility] [phone]

## 2022-01-03 DIAGNOSIS — H401132 Primary open-angle glaucoma, bilateral, moderate stage: Secondary | ICD-10-CM | POA: Diagnosis not present

## 2022-04-13 ENCOUNTER — Other Ambulatory Visit: Payer: Self-pay | Admitting: Orthopaedic Surgery

## 2022-04-17 ENCOUNTER — Other Ambulatory Visit: Payer: Self-pay | Admitting: Orthopaedic Surgery

## 2022-06-29 DIAGNOSIS — M170 Bilateral primary osteoarthritis of knee: Secondary | ICD-10-CM | POA: Diagnosis not present

## 2022-07-14 DIAGNOSIS — I10 Essential (primary) hypertension: Secondary | ICD-10-CM | POA: Diagnosis not present

## 2022-07-14 DIAGNOSIS — M6281 Muscle weakness (generalized): Secondary | ICD-10-CM | POA: Diagnosis not present

## 2022-08-08 ENCOUNTER — Ambulatory Visit: Payer: Medicare Other

## 2022-08-09 ENCOUNTER — Other Ambulatory Visit: Payer: Self-pay

## 2022-08-09 ENCOUNTER — Ambulatory Visit: Payer: Medicare Other | Attending: Physician Assistant | Admitting: Physical Therapy

## 2022-08-09 ENCOUNTER — Encounter: Payer: Self-pay | Admitting: Physical Therapy

## 2022-08-09 DIAGNOSIS — M6281 Muscle weakness (generalized): Secondary | ICD-10-CM | POA: Insufficient documentation

## 2022-08-09 DIAGNOSIS — R2689 Other abnormalities of gait and mobility: Secondary | ICD-10-CM | POA: Insufficient documentation

## 2022-08-09 DIAGNOSIS — R296 Repeated falls: Secondary | ICD-10-CM | POA: Diagnosis not present
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
## 2022-08-19 DIAGNOSIS — Z681 Body mass index (BMI) 19 or less, adult: Secondary | ICD-10-CM | POA: Diagnosis not present

## 2022-08-19 DIAGNOSIS — H6123 Impacted cerumen, bilateral: Secondary | ICD-10-CM | POA: Diagnosis not present

## 2022-08-19 DIAGNOSIS — Z23 Encounter for immunization: Secondary | ICD-10-CM | POA: Diagnosis not present

## 2022-08-21 ENCOUNTER — Ambulatory Visit: Payer: Medicare Other

## 2022-08-21 DIAGNOSIS — R2689 Other abnormalities of gait and mobility: Secondary | ICD-10-CM

## 2022-08-21 DIAGNOSIS — M6281 Muscle weakness (generalized): Secondary | ICD-10-CM

## 2022-08-21 DIAGNOSIS — R296 Repeated falls: Secondary | ICD-10-CM | POA: Diagnosis not present

## 2022-08-21 NOTE — Therapy (Signed)
OUTPATIENT PHYSICAL THERAPY TREATMENT NOTE   Patient Name: Jorge Long MRN: [id number] DOB:November 30, 1932, 88 y.o., male Today's Date: 08/21/2022  PCP: Maury Dus MD REFERRING PROVIDER: Olen Cordial, PA  END OF SESSION:   PT End of Session - 08/21/22 1031     Visit Number 2    Number of Visits 13    Date for PT Re-Evaluation 09/20/22    Authorization Type [employer] Medicare    Progress Note Due on Visit 10    PT Start Time 1032    PT Stop Time 1110    PT Time Calculation (min) 38 min    Equipment Utilized During Treatment Other (comment)   SPC   Activity Tolerance Patient tolerated treatment well    Behavior During Therapy WFL for tasks assessed/performed             Past Medical History:  Diagnosis Date   Aortic stenosis, moderate    Arrhythmia    BPH (benign prostatic hyperplasia)    Dr. Junious Silk, UROLOGY   Echocardiogram abnormal 04/03/2017   MILD TO MOD MR, MOD AV REGURG, MILD STENOSIS   GERD (gastroesophageal reflux disease)    Glaucoma    followed by Dr. Lindalou Hose, [facility]   Heart murmur    Hypercholesteremia    PURE   Hypertension    Thoracic aortic aneurysm without rupture [facility])    Past Surgical History:  Procedure Laterality Date   APPENDECTOMY     EYE SURGERY     I & D EXTREMITY Left 03/02/2014   Procedure: Removal of Left foot mass;  Surgeon: Marianna Payment, MD;  Location: [place];  Service: Orthopedics;  Laterality: Left;   Patient Active Problem List   Diagnosis Date Noted   Idiopathic medial aortopathy and arteriopathy ([place]) 06/09/2020   GERD (gastroesophageal reflux disease)    Hypertension    Thoracic aortic aneurysm (TAA) ([facility])    Glaucoma    Hypercholesteremia    Aortic stenosis, moderate    BPH (benign prostatic hyperplasia)    Echocardiogram abnormal 04/03/2017   Mass of left foot 02/26/2014    REFERRING DIAG: M62.81 (ICD-10-CM) - Muscle weakness (generalized)  THERAPY DIAG:  Other abnormalities of gait and  mobility  Repeated falls  Muscle weakness (generalized)  Rationale for Evaluation and Treatment Rehabilitation  PERTINENT HISTORY: Thoracic aortic aneurysm, HTN, GERD, abnormal echo  PRECAUTIONS: Fall  SUBJECTIVE: Pt presents to PT with no reports of pain, does have some R knee stiffness. Has been compliant with HEP with no adverse effect. He is ready to begin PT at this time.   PAIN:  Are you having pain: no Location: NA How would you describe your pain? Some stiffness R knee with prolonged positioning Best: NA Worst: NA Aggravating factors: stiffness with prolonged sitting and transfers Easing factors: movement    OBJECTIVE: (objective measures completed at initial evaluation unless otherwise dated)  DIAGNOSTIC FINDINGS:  None recently, previous R knee x ray showing arthritis per pt   PATIENT SURVEYS:  FOTO will assess at next visit, unable to access on this date     COGNITION:           Overall cognitive status: Within functional limits for tasks assessed                          SENSATION/NEURO: Light touch intact B LE, no apparent tremor or ataxia of extremities     POSTURE: rounded shoulders, forward head, and  increased thoracic kyphosis. Increased fwd trunk flexion with prolonged time standing       LOWER EXTREMITY MMT:     MMT Right eval Left eval  Hip flexion 4+ 4+  Hip abduction (modified sitting) 5 5  Hip internal rotation 4 4  Hip external rotation 4 4  Knee flexion 5 5  Knee extension 5 5   (Blank rows = not tested)   Comments: Some crepitus with knee testing B but nonpainful per pt report - focal exam with good strength although generalized weakness apparent with functional transfers     FUNCTIONAL TESTS:  5xSTS:  standard chair 15sec, heavy UE use from chair, unable to achieve full upright               Standard chair 19 sec, one UE from chair and one UE with SPC, able to achieve full upright but diminishes with repetition                          FGA: 12/30 - increased time with all activities, increased instability with dynamic tasks requiring intermittent CGA. Most difficulty with eyes closed and retro walking, unable to perform narrow BOS without assist     GAIT: Distance walked: within clinic Assistive device utilized: Single point cane Level of assistance: Modified independence (CGA during balance testing as above) Comments: step to/partial step through pattern, SPC in RUE, fwd flexed posture, reduced arm swing B, reduced hip extension       TODAY'S TREATMENT  [facility] Adult PT Treatment:                                                DATE: 08/21/2022  Therapeutic Exercise: NuStep lvl 4 UE/LE x 4 min while taking subjective STS with UE support 2x5 LAQ 2x10 2# each Supine clamshell 2x10 RTB Supine ball squeeze 2x10 - 5" hold Bridge 2x10 Standing hip abd/ext x 10 each Step up fwd x 5 6in L; x 5 4in R  [facility] Adult PT Treatment:                                                DATE: 08/09/2022  Therapeutic Exercise: STS 3x5 from chair (first set with [facility], following sets with RW for improved UE support), cues for pacing, full upright posture, and hand setup Standing hip extension x10 bilat w UE support on RW, cues for full upright, reduced compensations at trunk     PATIENT EDUCATION:  Education details: Pt education on PT impairments, prognosis, and POC. Rationale for interventions, safe/appropriate HEP performance Person educated: Patient Education method: Explanation, Demonstration, Tactile cues, Verbal cues, and Handouts Education comprehension: verbalized understanding, returned demonstration, verbal cues required, tactile cues required, and needs further education      HOME EXERCISE PROGRAM: Access Code: [email or web address] URL: [email or web address] Date: 08/09/2022 Prepared by: Fransisco Hertz   Exercises - Sit to Stand with Counter Support  - 1 x daily - 7 x weekly - 3 sets - 5 reps - Standing Hip  Extension with Counter Support  - 1 x daily - 7 x weekly - 3 sets - 10 reps   ASSESSMENT:   CLINICAL IMPRESSION: Pt was able to  complete all prescribed exercises with no adverse effect or increase in pain. Therapy focused on improving proximal hip and general LE strength. HEP updated for continued strengthening, will continue to progress as able per POC.      OBJECTIVE IMPAIRMENTS Abnormal gait, decreased activity tolerance, decreased balance, decreased endurance, decreased mobility, difficulty walking, decreased strength, and postural dysfunction.    ACTIVITY LIMITATIONS sitting, standing, squatting, stairs, and transfers   PARTICIPATION LIMITATIONS: community activity   PERSONAL FACTORS Age are also affecting patient's functional outcome.      GOALS: Goals reviewed with patient? No   SHORT TERM GOALS: Target date: 08/30/2022   Pt will demonstrate appropriate understanding and performance of initially prescribed HEP in order to facilitate improved independence with management of symptoms.  Baseline: HEP provided on eval Goal status: INITIAL    2. Therapist will assess FOTO at next visit and create appropriate long term goal based on score in order to reflect improved perception of function due to symptoms.            Baseline: Unable to access            Goal status: INITIAL     LONG TERM GOALS: Target date: 09/20/2022     1.  Pt will demonstrate report ability to walk on even for up to 60 min in order to demonstrate improved safety/tolerance for community ambulation. Baseline: Goal status: INITIAL   2. Pt will score greater than or equal to 20/30 on Functional Gait assessment in order to indicate reduced fall risk (cutoff score </= 22/30 predictive of falls per Alvino Chapel et al 2010, MCID 4 pts Beninato et al 2014)            Baseline: 12/30 on eval            Goal status: INITIAL     3. Pt will perform 5xSTS in <14 sec in order to demonstrate reduced fall risk and  improved functional independence. (MCID of 2.3sec)            Baseline: 19sec with assistive device and UE support (15sec without AD but unable to achieve full upright)            Goal status: INITIAL          PLAN: PT FREQUENCY: 2x/week   PT DURATION: 6 weeks   PLANNED INTERVENTIONS: Therapeutic exercises, Therapeutic activity, Neuromuscular re-education, Balance training, Gait training, Patient/Family education, Self Care, Stair training, Aquatic Therapy, Cryotherapy, Moist heat, Manual therapy, and Re-evaluation.   PLAN FOR NEXT SESSION: Review HEP, modify as needed. Progress postural stability exercises with emphasis on dual tasking, variable base of support, and variable surface compliance. Progress closed chain lower extremity strengthening as able/appropriate, would benefit from increased LE power output    Eloy End, PT 08/21/2022, 11:14 AM

## 2022-08-22 ENCOUNTER — Ambulatory Visit: Payer: Medicare Other

## 2022-08-22 DIAGNOSIS — R296 Repeated falls: Secondary | ICD-10-CM | POA: Diagnosis not present

## 2022-08-22 DIAGNOSIS — R2689 Other abnormalities of gait and mobility: Secondary | ICD-10-CM | POA: Diagnosis not present

## 2022-08-22 DIAGNOSIS — M6281 Muscle weakness (generalized): Secondary | ICD-10-CM

## 2022-08-22 NOTE — Therapy (Signed)
OUTPATIENT PHYSICAL THERAPY TREATMENT NOTE   Patient Name: Jorge Long MRN: [id number] DOB:05-16-1933, 88 y.o., male Today's Date: 08/22/2022  PCP: Maury Dus MD REFERRING PROVIDER: Olen Cordial, PA  END OF SESSION:   PT End of Session - 08/22/22 0958     Visit Number 3    Number of Visits 13    Date for PT Re-Evaluation 09/20/22    Authorization Type [employer] Medicare    Progress Note Due on Visit 10    PT Start Time 1000    PT Stop Time 1040    PT Time Calculation (min) 40 min    Equipment Utilized During Treatment Other (comment)   SPC   Activity Tolerance Patient tolerated treatment well    Behavior During Therapy [facility] for tasks assessed/performed              Past Medical History:  Diagnosis Date   Aortic stenosis, moderate    Arrhythmia    BPH (benign prostatic hyperplasia)    Dr. Junious Silk, UROLOGY   Echocardiogram abnormal 04/03/2017   MILD TO MOD MR, MOD AV REGURG, MILD STENOSIS   GERD (gastroesophageal reflux disease)    Glaucoma    followed by Dr. 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[place]) 06/09/2020   GERD (gastroesophageal reflux disease)    Hypertension    Thoracic aortic aneurysm (TAA) ([facility])    Glaucoma    Hypercholesteremia    Aortic stenosis, moderate    BPH (benign prostatic hyperplasia)    Echocardiogram abnormal 04/03/2017   Mass of left foot 02/26/2014    REFERRING DIAG: M62.81 (ICD-10-CM) - Muscle weakness (generalized)  THERAPY DIAG:  Other abnormalities of gait and  mobility  Repeated falls  Muscle weakness (generalized)  Rationale for Evaluation and Treatment Rehabilitation  PERTINENT HISTORY: Thoracic aortic aneurysm, HTN, GERD, abnormal echo  PRECAUTIONS: Fall  SUBJECTIVE: Pt presents to PT with reports of muscle soreness, but otherwise is doing well. Is ready to begin PT at this time.   PAIN:  Are you having pain: Yes Location: Right Knee Rating: 2/10  OBJECTIVE: (objective measures completed at initial evaluation unless otherwise dated)  PATIENT SURVEYS:  FOTO: 45% function; 52% predicted   COGNITION:           Overall cognitive status: Within functional limits for tasks assessed                          SENSATION/NEURO: Light touch intact B LE, no apparent tremor or ataxia of extremities     POSTURE: rounded shoulders, forward head, and increased thoracic kyphosis. Increased fwd trunk flexion with prolonged time standing       LOWER EXTREMITY MMT:     MMT Right eval Left eval  Hip flexion 4+ 4+  Hip abduction (modified sitting) 5 5  Hip internal rotation 4 4  Hip external rotation 4 4  Knee flexion 5 5  Knee extension 5  5   (Blank rows = not tested)   Comments: Some crepitus with knee testing B but nonpainful per pt report - focal exam with good strength although generalized weakness apparent with functional transfers     FUNCTIONAL TESTS:  5xSTS:  standard chair 15sec, heavy UE use from chair, unable to achieve full upright               Standard chair 19 sec, one UE from chair and one UE with SPC, able to achieve full upright but diminishes with repetition                         FGA: 12/30 - increased time with all activities, increased instability with dynamic tasks requiring intermittent CGA. Most difficulty with eyes closed and retro walking, unable to perform narrow BOS without assist     GAIT: Distance walked: within clinic Assistive device utilized: Single point cane Level of assistance: Modified  independence (CGA during balance testing as above) Comments: step to/partial step through pattern, SPC in RUE, fwd flexed posture, reduced arm swing B, reduced hip extension       TODAY'S TREATMENT  [facility] Adult PT Treatment:                                                DATE: 08/22/2022  Therapeutic Exercise: NuStep lvl 4 UE/LE x 4 min while taking subjective Supine SLR 2x10 each Bridge 2x10  Supine hamstring stretch with strap 2x30" R Supine ball squeeze 2x10 - 5" hold Seated hamstring stretch 2x30" R STS with UE support 2x5  [facility] Adult PT Treatment:                                                DATE: 08/21/2022  Therapeutic Exercise: NuStep lvl 4 UE/LE x 4 min while taking subjective STS with UE support 2x5 LAQ 2x10 2# each Supine clamshell 2x10 RTB Supine ball squeeze 2x10 - 5" hold Bridge 2x10 Standing hip abd/ext x 10 each Step up fwd x 5 6in L; x 5 4in R  [facility] Adult PT Treatment:                                                DATE: 08/09/2022  Therapeutic Exercise: STS 3x5 from chair (first set with [facility], following sets with RW for improved UE support), cues for pacing, full upright posture, and hand setup Standing hip extension x10 bilat w UE support on RW, cues for full upright, reduced compensations at trunk     PATIENT EDUCATION:  Education details: Pt education on PT impairments, prognosis, and POC. Rationale for interventions, safe/appropriate HEP performance Person educated: Patient Education method: Explanation, Demonstration, Tactile cues, Verbal cues, and Handouts Education comprehension: verbalized understanding, returned demonstration, verbal cues required, tactile cues required, and needs further education      HOME EXERCISE PROGRAM: Access Code: [email or web address] URL: [email or web address] Date: 08/09/2022 Prepared by: Enis Slipper   Exercises - Sit to Stand with Counter Support  - 1 x daily - 7 x weekly - 3 sets -  5 reps - Standing Hip Extension  with Counter Support  - 1 x daily - 7 x weekly - 3 sets - 10 reps   ASSESSMENT:   CLINICAL IMPRESSION: Pt was able to complete all prescribed exercises with no adverse effect or increase in pain. Therapy focused on improving LE strength and R knee ROM in order to improve functional mobility. Will continue to progress as able per POC.      OBJECTIVE IMPAIRMENTS Abnormal gait, decreased activity tolerance, decreased balance, decreased endurance, decreased mobility, difficulty walking, decreased strength, and postural dysfunction.    ACTIVITY LIMITATIONS sitting, standing, squatting, stairs, and transfers   PARTICIPATION LIMITATIONS: community activity   PERSONAL FACTORS Age are also affecting patient's functional outcome.      GOALS: Goals reviewed with patient? No   SHORT TERM GOALS: Target date: 08/30/2022   Pt will demonstrate appropriate understanding and performance of initially prescribed HEP in order to facilitate improved independence with management of symptoms.  Baseline: HEP provided on eval Goal status: MET    2. Therapist will assess FOTO at next visit and create appropriate long term goal based on score in order to reflect improved perception of function due to symptoms.            Baseline: Unable to access            Goal status: IMET     LONG TERM GOALS: Target date: 09/20/2022     1.  Pt will demonstrate report ability to walk on even for up to 60 min in order to demonstrate improved safety/tolerance for community ambulation. Baseline: 40mn Goal status: INITIAL   2. Pt will score greater than or equal to 20/30 on Functional Gait assessment in order to indicate reduced fall risk (cutoff score </= 22/30 predictive of falls per WErnestine Conradet al 2010, MCID 4 pts Beninato et al 2014)            Baseline: 12/30 on eval            Goal status: INITIAL     3. Pt will perform 5xSTS in <14 sec in order to demonstrate reduced fall risk and improved functional independence.  (MCID of 2.3sec)            Baseline: 19sec with assistive device and UE support (15sec without AD but unable to achieve full upright)            Goal status: INITIAL     4. Pt will improve FOTO function score to no less than 52% as proxy for functional improvement Baseline: 45% function Goal status: INITIAL   PLAN: PT FREQUENCY: 2x/week   PT DURATION: 6 weeks   PLANNED INTERVENTIONS: Therapeutic exercises, Therapeutic activity, Neuromuscular re-education, Balance training, Gait training, Patient/Family education, Self Care, Stair training, Aquatic Therapy, Cryotherapy, Moist heat, Manual therapy, and Re-evaluation.   PLAN FOR NEXT SESSION: Review HEP, modify as needed. Progress postural stability exercises with emphasis on dual tasking, variable base of support, and variable surface compliance. Progress closed chain lower extremity strengthening as able/appropriate, would benefit from increased LE power output    DWard Chatters PT 08/22/2022, 10:43 AM

## 2022-08-28 ENCOUNTER — Ambulatory Visit: Payer: Medicare Other | Attending: Physician Assistant

## 2022-08-28 DIAGNOSIS — M6281 Muscle weakness (generalized): Secondary | ICD-10-CM | POA: Insufficient documentation

## 2022-08-28 DIAGNOSIS — R2689 Other abnormalities of gait and mobility: Secondary | ICD-10-CM | POA: Insufficient documentation

## 2022-08-28 DIAGNOSIS — R296 Repeated falls: Secondary | ICD-10-CM | POA: Diagnosis not present

## 2022-08-28 NOTE — Therapy (Signed)
OUTPATIENT PHYSICAL THERAPY TREATMENT NOTE   Patient Name: Jorge Long MRN: [id number] DOB:09/30/1933, 88 y.o., male Today's Date: 08/28/2022  PCP: Maury Dus MD REFERRING PROVIDER: Olen Cordial, PA  END OF SESSION:   PT End of Session - 08/28/22 0908     Visit Number 4    Number of Visits 13    Date for PT Re-Evaluation 09/20/22    Authorization Type [employer] Medicare    Progress Note Due on Visit 10    PT Start Time 0915    PT Stop Time 0955    PT Time Calculation (min) 40 min    Equipment Utilized During Treatment Other (comment)   SPC   Activity Tolerance Patient tolerated treatment well    Behavior During Therapy [facility] for tasks assessed/performed               Past Medical History:  Diagnosis Date   Aortic stenosis, moderate    Arrhythmia    BPH (benign prostatic hyperplasia)    Dr. Junious Silk, UROLOGY   Echocardiogram abnormal 04/03/2017   MILD TO MOD MR, MOD AV REGURG, MILD STENOSIS   GERD (gastroesophageal reflux disease)    Glaucoma    followed by Dr. 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   GERD (gastroesophageal reflux disease)    Hypertension    Thoracic aortic aneurysm (TAA) ([facility])    Glaucoma    Hypercholesteremia    Aortic stenosis, moderate    BPH (benign prostatic hyperplasia)    Echocardiogram abnormal 04/03/2017   Mass of left foot 02/26/2014    REFERRING DIAG: M62.81 (ICD-10-CM) - Muscle weakness (generalized)  THERAPY DIAG:  Other abnormalities of gait and  mobility  Repeated falls  Muscle weakness (generalized)  Rationale for Evaluation and Treatment Rehabilitation  PERTINENT HISTORY: Thoracic aortic aneurysm, HTN, GERD, abnormal echo  PRECAUTIONS: Fall  SUBJECTIVE: Pt presents to PT with continued reports of knee stiffness, has been doing well otherwise. Has been compliant with HEP with no adverse effect. Is ready to begin PT at this time.   PAIN:  Are you having pain: Yes Location: Right Knee Rating: 2/10  OBJECTIVE: (objective measures completed at initial evaluation unless otherwise dated)  PATIENT SURVEYS:  FOTO: 45% function; 52% predicted   COGNITION:           Overall cognitive status: Within functional limits for tasks assessed                          SENSATION/NEURO: Light touch intact B LE, no apparent tremor or ataxia of extremities     POSTURE: rounded shoulders, forward head, and increased thoracic kyphosis. Increased fwd trunk flexion with prolonged time standing       LOWER EXTREMITY MMT:     MMT Right eval Left eval  Hip flexion 4+ 4+  Hip abduction (modified sitting) 5 5  Hip internal rotation 4 4  Hip external rotation  4 4  Knee flexion 5 5  Knee extension 5 5   (Blank rows = not tested)   Comments: Some crepitus with knee testing B but nonpainful per pt report - focal exam with good strength although generalized weakness apparent with functional transfers     FUNCTIONAL TESTS:  5xSTS:  standard chair 15sec, heavy UE use from chair, unable to achieve full upright               Standard chair 19 sec, one UE from chair and one UE with SPC, able to achieve full upright but diminishes with repetition                         FGA: 12/30 - increased time with all activities, increased instability with dynamic tasks requiring intermittent CGA. Most difficulty with eyes closed and retro walking, unable to perform narrow BOS without assist     GAIT: Distance walked: within clinic Assistive device  utilized: Single point cane Level of assistance: Modified independence (CGA during balance testing as above) Comments: step to/partial step through pattern, SPC in RUE, fwd flexed posture, reduced arm swing B, reduced hip extension       TODAY'S TREATMENT  [facility] Adult PT Treatment:                                                DATE: 08/28/2022  Therapeutic Exercise: NuStep lvl 5 UE/LE x 5 min while taking subjective LAQ 2x10 2.5#  STS with UE support 2x5 Supine SLR 2x10 each Bridge 2x10  Supine clamshell GTB 2x15  Supine hamstring stretch with strap 2x30" R Step up fwd 4in x 10 each Standing hip abd/ext 2x10 each  [facility] Adult PT Treatment:                                                DATE: 08/22/2022  Therapeutic Exercise: NuStep lvl 4 UE/LE x 4 min while taking subjective Supine SLR 2x10 each Bridge 2x10  Supine hamstring stretch with strap 2x30" R Supine ball squeeze 2x10 - 5" hold Seated hamstring stretch 2x30" R STS with UE support 2x5  [facility] Adult PT Treatment:                                                DATE: 08/21/2022  Therapeutic Exercise: NuStep lvl 4 UE/LE x 4 min while taking subjective STS with UE support 2x5 LAQ 2x10 2# each Supine clamshell 2x10 RTB Supine ball squeeze 2x10 - 5" hold Bridge 2x10 Standing hip abd/ext x 10 each Step up fwd x 5 6in L; x 5 4in R  [facility] Adult PT Treatment:                                                DATE: 08/09/2022  Therapeutic Exercise: STS 3x5 from chair (first set with [facility], following sets with RW for improved UE support), cues for pacing,  full upright posture, and hand setup Standing hip extension x10 bilat w UE support on RW, cues for full upright, reduced compensations at trunk     PATIENT EDUCATION:  Education details: Pt education on PT impairments, prognosis, and POC. Rationale for interventions, safe/appropriate HEP performance Person educated: Patient Education method: Explanation, Demonstration, Tactile cues,  Verbal cues, and Handouts Education comprehension: verbalized understanding, returned demonstration, verbal cues required, tactile cues required, and needs further education      HOME EXERCISE PROGRAM: Access Code: [email or web address] URL: [email or web address] Date: 08/09/2022 Prepared by: Enis Slipper   Exercises - Sit to Stand with Counter Support  - 1 x daily - 7 x weekly - 3 sets - 5 reps - Standing Hip Extension with Counter Support  - 1 x daily - 7 x weekly - 3 sets - 10 reps   ASSESSMENT:   CLINICAL IMPRESSION: Pt was able to complete all prescribed exercises with no adverse effect or increase in pain. Therapy focused on improving LE strength and R knee ROM in order to improve functional mobility. He continues to struggle with loaded knee flexion and weight transfer on R LE, especially with sit>stand and step ups. Will continue to progress as able per POC.      OBJECTIVE IMPAIRMENTS Abnormal gait, decreased activity tolerance, decreased balance, decreased endurance, decreased mobility, difficulty walking, decreased strength, and postural dysfunction.    ACTIVITY LIMITATIONS sitting, standing, squatting, stairs, and transfers   PARTICIPATION LIMITATIONS: community activity   PERSONAL FACTORS Age are also affecting patient's functional outcome.      GOALS: Goals reviewed with patient? No   SHORT TERM GOALS: Target date: 08/30/2022   Pt will demonstrate appropriate understanding and performance of initially prescribed HEP in order to facilitate improved independence with management of symptoms.  Baseline: HEP provided on eval Goal status: MET    2. Therapist will assess FOTO at next visit and create appropriate long term goal based on score in order to reflect improved perception of function due to symptoms.            Baseline: Unable to access            Goal status: IMET     LONG TERM GOALS: Target date: 09/20/2022     1.  Pt will demonstrate report ability to  walk on even for up to 60 min in order to demonstrate improved safety/tolerance for community ambulation. Baseline: 11mn Goal status: INITIAL   2. Pt will score greater than or equal to 20/30 on Functional Gait assessment in order to indicate reduced fall risk (cutoff score </= 22/30 predictive of falls per WErnestine Conradet al 2010, MCID 4 pts Beninato et al 2014)            Baseline: 12/30 on eval            Goal status: INITIAL     3. Pt will perform 5xSTS in <14 sec in order to demonstrate reduced fall risk and improved functional independence. (MCID of 2.3sec)            Baseline: 19sec with assistive device and UE support (15sec without AD but unable to achieve full upright)            Goal status: INITIAL     4. Pt will improve FOTO function score to no less than 52% as proxy for functional improvement Baseline: 45% function Goal status: INITIAL   PLAN: PT FREQUENCY: 2x/week   PT DURATION: 6 weeks   PLANNED  INTERVENTIONS: Therapeutic exercises, Therapeutic activity, Neuromuscular re-education, Balance training, Gait training, Patient/Family education, Self Care, Stair training, Aquatic Therapy, Cryotherapy, Moist heat, Manual therapy, and Re-evaluation.   PLAN FOR NEXT SESSION: Review HEP, modify as needed. Progress postural stability exercises with emphasis on dual tasking, variable base of support, and variable surface compliance. Progress closed chain lower extremity strengthening as able/appropriate, would benefit from increased LE power output    Ward Chatters, PT 08/28/2022, 9:56 AM

## 2022-08-29 NOTE — Therapy (Signed)
OUTPATIENT PHYSICAL THERAPY TREATMENT NOTE   Patient Name: Jorge Long MRN: [id number] DOB:Jun 08, 1933, 88 y.o., male Today's Date: 08/30/2022  PCP: Maury Dus MD REFERRING PROVIDER: Olen Cordial, PA  END OF SESSION:   PT End of Session - 08/30/22 0905     Visit Number 5    Number of Visits 13    Date for PT Re-Evaluation 09/20/22    Authorization Type [employer] Medicare    Progress Note Due on Visit 10    PT Start Time 0915    PT Stop Time 0955    PT Time Calculation (min) 40 min    Equipment Utilized During Treatment Other (comment)   SPC   Activity Tolerance Patient tolerated treatment well    Behavior During Therapy [facility] for tasks assessed/performed                Past Medical History:  Diagnosis Date   Aortic stenosis, moderate    Arrhythmia    BPH (benign prostatic hyperplasia)    Dr. Junious Silk, UROLOGY   Echocardiogram abnormal 04/03/2017   MILD TO MOD MR, MOD AV REGURG, MILD STENOSIS   GERD (gastroesophageal reflux disease)    Glaucoma    followed by Dr. 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[place]) 06/09/2020   GERD (gastroesophageal reflux disease)    Hypertension    Thoracic aortic aneurysm (TAA) ([facility])    Glaucoma    Hypercholesteremia    Aortic stenosis, moderate    BPH (benign prostatic hyperplasia)    Echocardiogram abnormal 04/03/2017   Mass of left foot 02/26/2014    REFERRING DIAG: M62.81 (ICD-10-CM) - Muscle weakness (generalized)  THERAPY DIAG:  Other abnormalities of gait and  mobility  Repeated falls  Muscle weakness (generalized)  Rationale for Evaluation and Treatment Rehabilitation  PERTINENT HISTORY: Thoracic aortic aneurysm, HTN, GERD, abnormal echo  PRECAUTIONS: Fall  SUBJECTIVE: Pt presents to PT with continued R knee stiffness. Has been compliant with HEP. Pt is ready to begin PT at this time.   PAIN:  Are you having pain: Yes Location: Right Knee Rating: 2/10  OBJECTIVE: (objective measures completed at initial evaluation unless otherwise dated)  PATIENT SURVEYS:  FOTO: 45% function; 52% predicted   COGNITION:           Overall cognitive status: Within functional limits for tasks assessed                          SENSATION/NEURO: Light touch intact B LE, no apparent tremor or ataxia of extremities     POSTURE: rounded shoulders, forward head, and increased thoracic kyphosis. Increased fwd trunk flexion with prolonged time standing       LOWER EXTREMITY MMT:     MMT Right eval Left eval  Hip flexion 4+ 4+  Hip abduction (modified sitting) 5 5  Hip internal rotation 4 4  Hip external rotation 4 4  Knee flexion 5 5  Knee extension 5 5   (Blank rows = not tested)   Comments: Some crepitus with knee testing B but nonpainful per pt report - focal exam with good strength although generalized weakness apparent with functional transfers     FUNCTIONAL TESTS:  5xSTS:  standard chair 15sec, heavy UE use from chair, unable to achieve full upright               Standard chair 19 sec, one UE from chair and one UE with SPC, able to achieve full upright but diminishes with repetition                         FGA: 12/30 - increased time with all activities, increased instability with dynamic tasks requiring intermittent CGA. Most difficulty with eyes closed and retro walking, unable to perform narrow BOS without assist     GAIT: Distance walked: within clinic Assistive device utilized: Single point cane Level of assistance: Modified  independence (CGA during balance testing as above) Comments: step to/partial step through pattern, SPC in RUE, fwd flexed posture, reduced arm swing B, reduced hip extension       TODAY'S TREATMENT  [facility] Adult PT Treatment:                                                DATE: 08/30/2022  Therapeutic Exercise: NuStep lvl 6 UE/LE x 5 min while taking subjective STS x 10 - high table LAQ 2x10 2.5#  Seated hamstring curl 2x10 RTB Supine SLR 3x10 each Supine quad set 2x10 - 5" hold Seated clamshell Black TB 2x15  Seated march 2x20 Black TB Step up fwd 2in x 10 each Standing hip abd/ext 2x10 each  PATIENT EDUCATION:  Education details: continue HEP Person educated: Patient Education method: Explanation, Demonstration, Tactile cues, Verbal cues, and Handouts Education comprehension: verbalized understanding, returned demonstration, verbal cues required, tactile cues required, and needs further education      HOME EXERCISE PROGRAM: Access Code: [email or web address] URL: [email or web address] Date: 08/09/2022 Prepared by: Enis Slipper   Exercises - Sit to Stand with Counter Support  - 1 x daily - 7 x weekly - 3 sets - 5 reps - Standing Hip Extension with Counter Support  - 1 x daily - 7 x weekly - 3 sets - 10 reps   ASSESSMENT:   CLINICAL IMPRESSION: Pt was able to complete prescribed exercises with no adverse effect. PT continued to work on quad and proximal hip strengthening. He continues to have eccentric quad weakness, especially evident when performing step ups. Pt continues to benefit from skilled PT and will continue to be seen and progressed as able per POC.      OBJECTIVE IMPAIRMENTS Abnormal gait, decreased activity tolerance, decreased balance, decreased endurance, decreased mobility, difficulty walking, decreased strength, and postural dysfunction.    ACTIVITY LIMITATIONS sitting, standing, squatting, stairs, and transfers   PARTICIPATION LIMITATIONS: community  activity   PERSONAL FACTORS Age are also affecting patient's functional outcome.      GOALS: Goals reviewed with patient? No   SHORT TERM GOALS: Target date: 08/30/2022   Pt will demonstrate appropriate understanding and performance of initially prescribed HEP in order to facilitate improved independence with management of symptoms.  Baseline: HEP provided on eval Goal status: MET    2. Therapist will assess FOTO at next visit  and create appropriate long term goal based on score in order to reflect improved perception of function due to symptoms.            Baseline: Unable to access            Goal status: IMET     LONG TERM GOALS: Target date: 09/20/2022     1.  Pt will demonstrate report ability to walk on even for up to 60 min in order to demonstrate improved safety/tolerance for community ambulation. Baseline: 32mn Goal status: INITIAL   2. Pt will score greater than or equal to 20/30 on Functional Gait assessment in order to indicate reduced fall risk (cutoff score </= 22/30 predictive of falls per WErnestine Conradet al 2010, MCID 4 pts Beninato et al 2014)            Baseline: 12/30 on eval            Goal status: INITIAL     3. Pt will perform 5xSTS in <14 sec in order to demonstrate reduced fall risk and improved functional independence. (MCID of 2.3sec)            Baseline: 19sec with assistive device and UE support (15sec without AD but unable to achieve full upright)            Goal status: INITIAL     4. Pt will improve FOTO function score to no less than 52% as proxy for functional improvement Baseline: 45% function Goal status: INITIAL   PLAN: PT FREQUENCY: 2x/week   PT DURATION: 6 weeks   PLANNED INTERVENTIONS: Therapeutic exercises, Therapeutic activity, Neuromuscular re-education, Balance training, Gait training, Patient/Family education, Self Care, Stair training, Aquatic Therapy, Cryotherapy, Moist heat, Manual therapy, and Re-evaluation.   PLAN FOR NEXT  SESSION: Review HEP, modify as needed. Progress postural stability exercises with emphasis on dual tasking, variable base of support, and variable surface compliance. Progress closed chain lower extremity strengthening as able/appropriate, would benefit from increased LE power output    DWard Chatters PT 08/30/2022, 9:55 AM

## 2022-08-30 ENCOUNTER — Ambulatory Visit: Payer: Medicare Other

## 2022-08-30 DIAGNOSIS — R2689 Other abnormalities of gait and mobility: Secondary | ICD-10-CM

## 2022-08-30 DIAGNOSIS — R296 Repeated falls: Secondary | ICD-10-CM | POA: Diagnosis not present

## 2022-08-30 DIAGNOSIS — M6281 Muscle weakness (generalized): Secondary | ICD-10-CM | POA: Diagnosis not present

## 2022-09-04 ENCOUNTER — Ambulatory Visit: Payer: Medicare Other

## 2022-09-04 DIAGNOSIS — M6281 Muscle weakness (generalized): Secondary | ICD-10-CM | POA: Diagnosis not present

## 2022-09-04 DIAGNOSIS — R296 Repeated falls: Secondary | ICD-10-CM | POA: Diagnosis not present

## 2022-09-04 DIAGNOSIS — R2689 Other abnormalities of gait and mobility: Secondary | ICD-10-CM

## 2022-09-04 NOTE — Therapy (Signed)
OUTPATIENT PHYSICAL THERAPY TREATMENT NOTE   Patient Name: Jorge Long MRN: [id number] DOB:[birth year], 88 y.o., male Today's Date: 09/04/2022  PCP: Maury Dus MD REFERRING PROVIDER: Olen Cordial, PA  END OF SESSION:   PT End of Session - 09/04/22 0916     Visit Number 6    Number of Visits 13    Date for PT Re-Evaluation 09/20/22    Authorization Type [employer] Medicare    Progress Note Due on Visit 10    PT Start Time 0916    PT Stop Time 0954    PT Time Calculation (min) 38 min    Equipment Utilized During Treatment Other (comment)   SPC   Activity Tolerance Patient tolerated treatment well    Behavior During Therapy [facility] for tasks assessed/performed                 Past Medical History:  Diagnosis Date   Aortic stenosis, moderate    Arrhythmia    BPH (benign prostatic hyperplasia)    Dr. Junious Silk, UROLOGY   Echocardiogram abnormal 04/03/2017   MILD TO MOD MR, MOD AV REGURG, MILD STENOSIS   GERD (gastroesophageal reflux disease)    Glaucoma    followed by Dr. 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/2020   GERD (gastroesophageal reflux disease)    Hypertension    Thoracic aortic aneurysm (TAA) ([facility])    Glaucoma    Hypercholesteremia    Aortic stenosis, moderate    BPH (benign prostatic hyperplasia)    Echocardiogram abnormal 04/03/2017   Mass of left foot 02/26/2014    REFERRING DIAG: M62.81 (ICD-10-CM) - Muscle weakness (generalized)  THERAPY DIAG:  Other abnormalities of gait and  mobility  Repeated falls  Muscle weakness (generalized)  Rationale for Evaluation and Treatment Rehabilitation  PERTINENT HISTORY: Thoracic aortic aneurysm, HTN, GERD, abnormal echo  PRECAUTIONS: Fall  SUBJECTIVE: Pt presents to PT with continued R knee stiffness. Has been compliant with HEP with no adverse effect. Pt is ready to begin PT at this time.   PAIN:  Are you having pain: Yes Location: Right Knee Rating: 2/10  OBJECTIVE: (objective measures completed at initial evaluation unless otherwise dated)  PATIENT SURVEYS:  FOTO: 45% function; 52% predicted   COGNITION:           Overall cognitive status: Within functional limits for tasks assessed                          SENSATION/NEURO: Light touch intact B LE, no apparent tremor or ataxia of extremities     POSTURE: rounded shoulders, forward head, and increased thoracic kyphosis. Increased fwd trunk flexion with prolonged time standing       LOWER EXTREMITY MMT:     MMT Right eval Left eval  Hip flexion 4+ 4+  Hip abduction (modified sitting) 5 5  Hip internal rotation 4 4  Hip external rotation 4 4  Knee flexion 5 5  Knee extension 5 5   (Blank rows = not tested)   Comments: Some crepitus with knee testing B but nonpainful per pt report - focal exam with good strength although generalized weakness apparent with functional transfers     FUNCTIONAL TESTS:  5xSTS:  standard chair 15sec, heavy UE use from chair, unable to achieve full upright               Standard chair 19 sec, one UE from chair and one UE with SPC, able to achieve full upright but diminishes with repetition                         FGA: 12/30 - increased time with all activities, increased instability with dynamic tasks requiring intermittent CGA. Most difficulty with eyes closed and retro walking, unable to perform narrow BOS without assist     GAIT: Distance walked: within clinic Assistive device utilized: Single point cane Level of  assistance: Modified independence (CGA during balance testing as above) Comments: step to/partial step through pattern, SPC in RUE, fwd flexed posture, reduced arm swing B, reduced hip extension       TODAY'S TREATMENT  [facility] Adult PT Treatment:                                                DATE: 09/04/2022  Therapeutic Exercise: NuStep lvl 6 UE/LE x 5 min while taking subjective STS x 10 - high table LAQ 2x10 2.5#  Seated march 2x20 2.5# Seated hamstring curl 2x10 RTB Supine SLR 3x10 each Supine quad set 2x10 - 5" hold Supne clamshell Black TB 2x15  Step up fwd 2in x 10 each Lateral walk 2# in // x 3 laps Walking march in // 2# - UE support x 2 laps Standing hip abd/ext 2x10 each 2#  [facility] Adult PT Treatment:                                                DATE: 08/30/2022  Therapeutic Exercise: NuStep lvl 6 UE/LE x 5 min while taking subjective STS x 10 - high table LAQ 2x10 2.5#  Seated hamstring curl 2x10 RTB Supine SLR 3x10 each Supine quad set 2x10 - 5" hold Seated clamshell Black TB 2x15  Seated march 2x20 Black TB Step up fwd 2in x 10 each Standing hip abd/ext 2x10 each  PATIENT EDUCATION:  Education details: continue HEP Person educated: Patient Education method: Explanation, Demonstration, Tactile cues, Verbal cues, and Handouts Education comprehension: verbalized understanding, returned demonstration, verbal cues required, tactile cues required, and needs further education      HOME EXERCISE PROGRAM: Access Code: [email or web address] URL: [email or web address] Date: 08/09/2022 Prepared by: Enis Slipper   Exercises - Sit to Stand with Counter Support  - 1 x daily - 7 x weekly - 3 sets - 5 reps - Standing Hip Extension with Counter Support  - 1 x daily - 7 x weekly - 3 sets - 10 reps   ASSESSMENT:   CLINICAL IMPRESSION: Pt was able to once again complete all prescribed exercises with no adverse effect, although continues to have pain with R knee flexion.  Therapy today continue to focus  on improving LE strength and balance. Pt is progressing well, will continue per POC as prescribed.      OBJECTIVE IMPAIRMENTS Abnormal gait, decreased activity tolerance, decreased balance, decreased endurance, decreased mobility, difficulty walking, decreased strength, and postural dysfunction.    ACTIVITY LIMITATIONS sitting, standing, squatting, stairs, and transfers   PARTICIPATION LIMITATIONS: community activity   PERSONAL FACTORS Age are also affecting patient's functional outcome.      GOALS: Goals reviewed with patient? No   SHORT TERM GOALS: Target date: 08/30/2022   Pt will demonstrate appropriate understanding and performance of initially prescribed HEP in order to facilitate improved independence with management of symptoms.  Baseline: HEP provided on eval Goal status: MET    2. Therapist will assess FOTO at next visit and create appropriate long term goal based on score in order to reflect improved perception of function due to symptoms.            Baseline: Unable to access            Goal status: IMET     LONG TERM GOALS: Target date: 09/20/2022     1.  Pt will demonstrate report ability to walk on even for up to 60 min in order to demonstrate improved safety/tolerance for community ambulation. Baseline: 9mn Goal status: INITIAL   2. Pt will score greater than or equal to 20/30 on Functional Gait assessment in order to indicate reduced fall risk (cutoff score </= 22/30 predictive of falls per WErnestine Conradet al 2010, MCID 4 pts Beninato et al 2014)            Baseline: 12/30 on eval            Goal status: INITIAL     3. Pt will perform 5xSTS in <14 sec in order to demonstrate reduced fall risk and improved functional independence. (MCID of 2.3sec)            Baseline: 19sec with assistive device and UE support (15sec without AD but unable to achieve full upright)            Goal status: INITIAL     4. Pt will improve FOTO function  score to no less than 52% as proxy for functional improvement Baseline: 45% function Goal status: INITIAL   PLAN: PT FREQUENCY: 2x/week   PT DURATION: 6 weeks   PLANNED INTERVENTIONS: Therapeutic exercises, Therapeutic activity, Neuromuscular re-education, Balance training, Gait training, Patient/Family education, Self Care, Stair training, Aquatic Therapy, Cryotherapy, Moist heat, Manual therapy, and Re-evaluation.   PLAN FOR NEXT SESSION: Review HEP, modify as needed. Progress postural stability exercises with emphasis on dual tasking, variable base of support, and variable surface compliance. Progress closed chain lower extremity strengthening as able/appropriate, would benefit from increased LE power output    DWard Chatters PT 09/04/2022, 9:54 AM

## 2022-09-06 ENCOUNTER — Ambulatory Visit: Payer: Medicare Other

## 2022-09-06 DIAGNOSIS — R296 Repeated falls: Secondary | ICD-10-CM | POA: Diagnosis not present

## 2022-09-06 DIAGNOSIS — M6281 Muscle weakness (generalized): Secondary | ICD-10-CM | POA: Diagnosis not present

## 2022-09-06 DIAGNOSIS — R2689 Other abnormalities of gait and mobility: Secondary | ICD-10-CM | POA: Diagnosis not present

## 2022-09-06 NOTE — Therapy (Signed)
OUTPATIENT PHYSICAL THERAPY TREATMENT NOTE   Patient Name: Eeshan Verbrugge MRN: [id number] DOB:1933-08-26, 88 y.o., male Today's Date: 09/06/2022  PCP: Maury Dus MD REFERRING PROVIDER: Olen Cordial, PA  END OF SESSION:   PT End of Session - 09/06/22 0955     Visit Number 7    Number of Visits 13    Date for PT Re-Evaluation 09/20/22    Authorization Type [employer] Medicare    Progress Note Due on Visit 10    PT Start Time 1000    PT Stop Time 1039    PT Time Calculation (min) 39 min    Equipment Utilized During Treatment Other (comment)   SPC   Activity Tolerance Patient tolerated treatment well    Behavior During Therapy WFL for tasks assessed/performed                  Past Medical History:  Diagnosis Date   Aortic stenosis, moderate    Arrhythmia    BPH (benign prostatic hyperplasia)    Dr. Junious Silk, UROLOGY   Echocardiogram abnormal 04/03/2017   MILD TO MOD MR, MOD AV REGURG, MILD STENOSIS   GERD (gastroesophageal reflux disease)    Glaucoma    followed by Dr. 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[place]) 06/09/2020   GERD (gastroesophageal reflux disease)    Hypertension    Thoracic aortic aneurysm (TAA) ([facility])    Glaucoma    Hypercholesteremia    Aortic stenosis, moderate    BPH (benign prostatic hyperplasia)    Echocardiogram abnormal 04/03/2017   Mass of left foot 02/26/2014    REFERRING DIAG: M62.81 (ICD-10-CM) - Muscle weakness (generalized)  THERAPY DIAG:  Other abnormalities of gait and  mobility  Repeated falls  Muscle weakness (generalized)  Rationale for Evaluation and Treatment Rehabilitation  PERTINENT HISTORY: Thoracic aortic aneurysm, HTN, GERD, abnormal echo  PRECAUTIONS: Fall  SUBJECTIVE: Pt presents to PT with no current reports of knee pain, does have stiffness. Has been compliant with HEP with no adverse effect. Pt is ready to begin PT at this time.   PAIN:  Are you having pain: No Location: Right Knee Rating: 0/10  OBJECTIVE: (objective measures completed at initial evaluation unless otherwise dated)  PATIENT SURVEYS:  FOTO: 45% function; 52% predicted   COGNITION:           Overall cognitive status: Within functional limits for tasks assessed                          SENSATION/NEURO: Light touch intact B LE, no apparent tremor or ataxia of extremities     POSTURE: rounded shoulders, forward head, and increased thoracic kyphosis. Increased fwd trunk flexion with prolonged time standing       LOWER EXTREMITY MMT:     MMT Right eval Left eval  Hip flexion 4+ 4+  Hip abduction (modified sitting) 5 5  Hip internal rotation 4 4  Hip external rotation 4 4  Knee flexion 5 5  Knee extension 5 5   (Blank rows = not tested)   Comments: Some crepitus with knee testing B but nonpainful per pt report - focal exam with good strength although generalized weakness apparent with functional transfers     FUNCTIONAL TESTS:  5xSTS:  standard chair 15sec, heavy UE use from chair, unable to achieve full upright               Standard chair 19 sec, one UE from chair and one UE with SPC, able to achieve full upright but diminishes with repetition                         FGA: 12/30 - increased time with all activities, increased instability with dynamic tasks requiring intermittent CGA. Most difficulty with eyes closed and retro walking, unable to perform narrow BOS without assist     GAIT: Distance walked: within clinic Assistive device utilized:  Single point cane Level of assistance: Modified independence (CGA during balance testing as above) Comments: step to/partial step through pattern, SPC in RUE, fwd flexed posture, reduced arm swing B, reduced hip extension       TODAY'S TREATMENT  [facility] Adult PT Treatment:                                                DATE: 09/06/2022  Therapeutic Exercise: NuStep lvl 6 UE/LE x 5 min while taking subjective STS x 10 - high table LAQ 2x10 2.5#  Seated march 2x20 2.5# Seated hamstring curl 3x10 GTB Supine SLR 3x10 each Supine quad set 2x10 - 5" hold Supne clamshell Black TB 3x15  Step up fwd 4in 2x10 R leading Lateral walk 2.5# at counter x 3 laps Standing hip abd/ext 2x10 each 2.5#  [facility] Adult PT Treatment:                                                DATE: 09/04/2022  Therapeutic Exercise: NuStep lvl 6 UE/LE x 5 min while taking subjective STS x 10 - high table LAQ 2x10 2.5#  Seated march 2x20 2.5# Seated hamstring curl 2x10 RTB Supine SLR 3x10 each Supine quad set 2x10 - 5" hold Supne clamshell Black TB 2x15  Step up fwd 2in x 10 each Lateral walk 2# in // x 3 laps Walking march in // 2# - UE support x 2 laps Standing hip abd/ext 2x10 each 2#  [facility] Adult PT Treatment:                                                DATE: 08/30/2022  Therapeutic Exercise: NuStep lvl 6 UE/LE x 5 min while taking subjective STS x 10 - high table LAQ 2x10 2.5#  Seated hamstring curl 2x10 RTB Supine SLR 3x10 each Supine quad set 2x10 - 5" hold Seated clamshell Black TB 2x15  Seated march 2x20 Black TB Step up fwd 2in x 10 each Standing hip abd/ext 2x10 each  PATIENT EDUCATION:  Education details: continue HEP Person  educated: Patient Education method: Explanation, Demonstration, Tactile cues, Verbal cues, and Handouts Education comprehension: verbalized understanding, returned demonstration, verbal cues required, tactile cues required, and needs further education      HOME EXERCISE  PROGRAM: Access Code: [email or web address] URL: [email or web address] Date: 08/09/2022 Prepared by: Enis Slipper   Exercises - Sit to Stand with Counter Support  - 1 x daily - 7 x weekly - 3 sets - 5 reps - Standing Hip Extension with Counter Support  - 1 x daily - 7 x weekly - 3 sets - 10 reps   ASSESSMENT:   CLINICAL IMPRESSION: Pt was able to once again complete all prescribed exercises with no adverse effect, although continues to have pain with R knee flexion. Therapy today continue to focus on improving LE strength and balance. Does continue to struggle with loaded knee flexion. Will try and incorporate step downs at next visit.      OBJECTIVE IMPAIRMENTS Abnormal gait, decreased activity tolerance, decreased balance, decreased endurance, decreased mobility, difficulty walking, decreased strength, and postural dysfunction.    ACTIVITY LIMITATIONS sitting, standing, squatting, stairs, and transfers   PARTICIPATION LIMITATIONS: community activity   PERSONAL FACTORS Age are also affecting patient's functional outcome.      GOALS: Goals reviewed with patient? No   SHORT TERM GOALS: Target date: 08/30/2022   Pt will demonstrate appropriate understanding and performance of initially prescribed HEP in order to facilitate improved independence with management of symptoms.  Baseline: HEP provided on eval Goal status: MET    2. Therapist will assess FOTO at next visit and create appropriate long term goal based on score in order to reflect improved perception of function due to symptoms.            Baseline: Unable to access            Goal status: IMET     LONG TERM GOALS: Target date: 09/20/2022     1.  Pt will demonstrate report ability to walk on even for up to 60 min in order to demonstrate improved safety/tolerance for community ambulation. Baseline: 55mn Goal status: INITIAL   2. Pt will score greater than or equal to 20/30 on Functional Gait assessment in order to  indicate reduced fall risk (cutoff score </= 22/30 predictive of falls per WErnestine Conradet al 2010, MCID 4 pts Beninato et al 2014)            Baseline: 12/30 on eval            Goal status: INITIAL     3. Pt will perform 5xSTS in <14 sec in order to demonstrate reduced fall risk and improved functional independence. (MCID of 2.3sec)            Baseline: 19sec with assistive device and UE support (15sec without AD but unable to achieve full upright)            Goal status: INITIAL     4. Pt will improve FOTO function score to no less than 52% as proxy for functional improvement Baseline: 45% function Goal status: INITIAL   PLAN: PT FREQUENCY: 2x/week   PT DURATION: 6 weeks   PLANNED INTERVENTIONS: Therapeutic exercises, Therapeutic activity, Neuromuscular re-education, Balance training, Gait training, Patient/Family education, Self Care, Stair training, Aquatic Therapy, Cryotherapy, Moist heat, Manual therapy, and Re-evaluation.   PLAN FOR NEXT SESSION: Review HEP, modify as needed. Progress postural stability exercises with emphasis on dual tasking, variable base of support, and variable surface compliance. Progress closed chain  lower extremity strengthening as able/appropriate, would benefit from increased LE power output    Ward Chatters, PT 09/06/2022, 10:40 AM

## 2022-09-11 NOTE — Therapy (Signed)
OUTPATIENT PHYSICAL THERAPY TREATMENT NOTE   Patient Name: Jorge Long MRN: [id number] DOB:1933/06/20, 88 y.o., male Today's Date: 09/12/2022  PCP: Maury Dus MD REFERRING PROVIDER: Olen Cordial, PA  END OF SESSION:   PT End of Session - 09/12/22 0956     Visit Number 8    Number of Visits 13    Date for PT Re-Evaluation 09/20/22    Authorization Type [employer] Medicare    Progress Note Due on Visit 10    PT Start Time 1000    PT Stop Time 1043    PT Time Calculation (min) 43 min    Equipment Utilized During Treatment Other (comment)   SPC   Activity Tolerance Patient tolerated treatment well    Behavior During Therapy WFL for tasks assessed/performed                   Past Medical History:  Diagnosis Date   Aortic stenosis, moderate    Arrhythmia    BPH (benign prostatic hyperplasia)    Dr. Junious Silk, UROLOGY   Echocardiogram abnormal 04/03/2017   MILD TO MOD MR, MOD AV REGURG, MILD STENOSIS   GERD (gastroesophageal reflux disease)    Glaucoma    followed by Dr. 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/2020   GERD (gastroesophageal reflux disease)    Hypertension    Thoracic aortic aneurysm (TAA) ([facility])    Glaucoma    Hypercholesteremia    Aortic stenosis, moderate    BPH (benign prostatic hyperplasia)    Echocardiogram abnormal 04/03/2017   Mass of left foot 02/26/2014    REFERRING DIAG: M62.81 (ICD-10-CM) - Muscle weakness (generalized)  THERAPY DIAG:  Other abnormalities of gait  and mobility  Repeated falls  Muscle weakness (generalized)  Rationale for Evaluation and Treatment Rehabilitation  PERTINENT HISTORY: Thoracic aortic aneurysm, HTN, GERD, abnormal echo  PRECAUTIONS: Fall  SUBJECTIVE: Pt presents to PT with reports of decreased R knee discomfort. Has been compliant with HEP with no adverse effect. Pt is ready to begin PT at this time.   PAIN:  Are you having pain: No Location: Right Knee Rating: 0/10  OBJECTIVE: (objective measures completed at initial evaluation unless otherwise dated)  PATIENT SURVEYS:  FOTO: 45% function; 52% predicted   COGNITION:           Overall cognitive status: Within functional limits for tasks assessed                          SENSATION/NEURO: Light touch intact B LE, no apparent tremor or ataxia of extremities     POSTURE: rounded shoulders, forward head, and increased thoracic kyphosis. Increased fwd trunk flexion with prolonged time standing       LOWER EXTREMITY MMT:     MMT Right eval Left eval  Hip flexion 4+ 4+  Hip abduction (modified sitting) 5 5  Hip internal rotation 4 4  Hip external  rotation 4 4  Knee flexion 5 5  Knee extension 5 5   (Blank rows = not tested)   Comments: Some crepitus with knee testing B but nonpainful per pt report - focal exam with good strength although generalized weakness apparent with functional transfers     FUNCTIONAL TESTS:  5xSTS:  standard chair 15sec, heavy UE use from chair, unable to achieve full upright               Standard chair 19 sec, one UE from chair and one UE with SPC, able to achieve full upright but diminishes with repetition                         FGA: 12/30 - increased time with all activities, increased instability with dynamic tasks requiring intermittent CGA. Most difficulty with eyes closed and retro walking, unable to perform narrow BOS without assist     GAIT: Distance walked: within clinic Assistive device utilized: Single point  cane Level of assistance: Modified independence (CGA during balance testing as above) Comments: step to/partial step through pattern, SPC in RUE, fwd flexed posture, reduced arm swing B, reduced hip extension       TODAY'S TREATMENT  [facility] Adult PT Treatment:                                                DATE: 09/12/2022  Therapeutic Exercise: NuStep lvl 5 UE/LE x 5 min while taking subjective Step up fwd 4in 2x10 R leading Lateral walk 2.5# in // x 3 laps Standing hip abd/ext 2x10 each 2.5# Standing march 2x10 2.5#  LAQ 2x10 2.5#  Seated hamstring curl 3x10 BTB Supine SLR 3x10 each Supine quad set 2x10 - 5" hold Supne clamshell Black TB 3x15   [facility] Adult PT Treatment:                                                DATE: 09/06/2022  Therapeutic Exercise: NuStep lvl 6 UE/LE x 5 min while taking subjective STS x 10 - high table LAQ 2x10 2.5#  Seated march 2x20 2.5# Seated hamstring curl 3x10 GTB Supine SLR 3x10 each Supine quad set 2x10 - 5" hold Supne clamshell Black TB 3x15  Step up fwd 4in 2x10 R leading Lateral walk 2.5# at counter x 3 laps Standing hip abd/ext 2x10 each 2.5#  [facility] Adult PT Treatment:                                                DATE: 09/04/2022  Therapeutic Exercise: NuStep lvl 6 UE/LE x 5 min while taking subjective STS x 10 - high table LAQ 2x10 2.5#  Seated march 2x20 2.5# Seated hamstring curl 2x10 RTB Supine SLR 3x10 each Supine quad set 2x10 - 5" hold Supne clamshell Black TB 2x15  Step up fwd 2in x 10 each Lateral walk 2# in // x 3 laps Walking march in // 2# - UE support x 2 laps Standing hip abd/ext 2x10 each 2#  PATIENT EDUCATION:  Education details: continue HEP   Person educated: Patient Education method: Explanation, Demonstration, Tactile cues, Verbal cues, and Handouts Education comprehension: verbalized understanding, returned demonstration, verbal cues required, tactile cues required, and needs further education      HOME  EXERCISE PROGRAM: Access Code: [email or web address] URL: [email or web address] Date: 08/09/2022 Prepared by: Enis Slipper   Exercises - Sit to Stand with Counter Support  - 1 x daily - 7 x weekly - 3 sets - 5 reps - Standing Hip Extension with Counter Support  - 1 x daily - 7 x weekly - 3 sets - 10 reps   ASSESSMENT:   CLINICAL IMPRESSION: Pt was again able to complete all prescribed exercises today with emphasis on improving proximal hip and quad strength. He continues to have difficulty with R knee ext secondary to significant OA and weakness. He does continue to progress well with therapy overall and will continue to be seen     OBJECTIVE IMPAIRMENTS Abnormal gait, decreased activity tolerance, decreased balance, decreased endurance, decreased mobility, difficulty walking, decreased strength, and postural dysfunction.    ACTIVITY LIMITATIONS sitting, standing, squatting, stairs, and transfers   PARTICIPATION LIMITATIONS: community activity   PERSONAL FACTORS Age are also affecting patient's functional outcome.      GOALS: Goals reviewed with patient? No   SHORT TERM GOALS: Target date: 08/30/2022   Pt will demonstrate appropriate understanding and performance of initially prescribed HEP in order to facilitate improved independence with management of symptoms.  Baseline: HEP provided on eval Goal status: MET    2. Therapist will assess FOTO at next visit and create appropriate long term goal based on score in order to reflect improved perception of function due to symptoms.            Baseline: Unable to access            Goal status: IMET     LONG TERM GOALS: Target date: 09/20/2022     1.  Pt will demonstrate report ability to walk on even for up to 60 min in order to demonstrate improved safety/tolerance for community ambulation. Baseline: 58mn Goal status: INITIAL   2. Pt will score greater than or equal to 20/30 on Functional Gait assessment in order to  indicate reduced fall risk (cutoff score </= 22/30 predictive of falls per WErnestine Conradet al 2010, MCID 4 pts Beninato et al 2014)            Baseline: 12/30 on eval            Goal status: INITIAL     3. Pt will perform 5xSTS in <14 sec in order to demonstrate reduced fall risk and improved functional independence. (MCID of 2.3sec)            Baseline: 19sec with assistive device and UE support (15sec without AD but unable to achieve full upright)            Goal status: INITIAL     4. Pt will improve FOTO function score to no less than 52% as proxy for functional improvement Baseline: 45% function Goal status: INITIAL   PLAN: PT FREQUENCY: 2x/week   PT DURATION: 6 weeks   PLANNED INTERVENTIONS: Therapeutic exercises, Therapeutic activity, Neuromuscular re-education, Balance training, Gait training, Patient/Family education, Self Care, Stair training, Aquatic Therapy, Cryotherapy, Moist heat, Manual therapy, and Re-evaluation.   PLAN FOR NEXT SESSION: Review HEP, modify as needed. Progress postural stability exercises with emphasis on dual tasking, variable base of support, and variable surface compliance. Progress closed chain lower extremity  strengthening as able/appropriate, would benefit from increased LE power output    Ward Chatters, PT 09/12/2022, 10:47 AM

## 2022-09-12 ENCOUNTER — Ambulatory Visit: Payer: Medicare Other

## 2022-09-12 DIAGNOSIS — R296 Repeated falls: Secondary | ICD-10-CM | POA: Diagnosis not present

## 2022-09-12 DIAGNOSIS — R2689 Other abnormalities of gait and mobility: Secondary | ICD-10-CM | POA: Diagnosis not present

## 2022-09-12 DIAGNOSIS — M6281 Muscle weakness (generalized): Secondary | ICD-10-CM

## 2022-09-12 NOTE — Therapy (Signed)
OUTPATIENT PHYSICAL THERAPY TREATMENT NOTE/PROGRESS NOTE  Progress Note Reporting Period 08/21/2022 to 09/14/2022  See note below for Objective Data and Assessment of Progress/Goals.    Patient Name: Jorge Long MRN: [id number] DOB:04/08/1933, 88 y.o., male Today's Date: 09/14/2022  PCP: Maury Dus MD REFERRING PROVIDER: Olen Cordial, PA  END OF SESSION:   PT End of Session - 09/14/22 0952     Visit Number 9    Number of Visits 13    Date for PT Re-Evaluation 10/27/22    Authorization Type [employer] Medicare    Progress Note Due on Visit 10    PT Start Time 0958    PT Stop Time 1038    PT Time Calculation (min) 40 min    Equipment Utilized During Treatment Other (comment)   SPC   Activity Tolerance Patient tolerated treatment well    Behavior During Therapy [facility] for tasks assessed/performed                    Past Medical History:  Diagnosis Date   Aortic stenosis, moderate    Arrhythmia    BPH (benign prostatic hyperplasia)    Dr. Junious Silk, UROLOGY   Echocardiogram abnormal 04/03/2017   MILD TO MOD MR, MOD AV REGURG, MILD STENOSIS   GERD (gastroesophageal reflux disease)    Glaucoma    followed by Dr. 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[place]) 06/09/2020   GERD (gastroesophageal reflux disease)    Hypertension    Thoracic aortic aneurysm (TAA) ([facility])    Glaucoma    Hypercholesteremia    Aortic stenosis, moderate    BPH (benign prostatic hyperplasia)    Echocardiogram abnormal 04/03/2017   Mass  of left foot 02/26/2014    REFERRING DIAG: M62.81 (ICD-10-CM) - Muscle weakness (generalized)  THERAPY DIAG:  Other abnormalities of gait and mobility - Plan: PT plan of care cert/re-cert  Repeated falls - Plan: PT plan of care cert/re-cert  Muscle weakness (generalized) - Plan: PT plan of care cert/re-cert  Rationale for Evaluation and Treatment Rehabilitation  PERTINENT HISTORY: Thoracic aortic aneurysm, HTN, GERD, abnormal echo  PRECAUTIONS: Fall  SUBJECTIVE: Pt presents to PT with reports of R knee stiffness. He has been compliant with HEP with no adverse effects. Pt is ready to begin PT at this time. Pt is ready to begin PT at this time.   PAIN:  Are you having pain: Yes Location: Right Knee Rating: 2/10  OBJECTIVE: (objective measures completed at initial evaluation unless otherwise dated)  PATIENT SURVEYS:  FOTO: 45% function; 52% predicted   COGNITION:           Overall cognitive status: Within functional limits for tasks assessed                          SENSATION/NEURO: Light touch intact B LE, no apparent tremor or ataxia of extremities     POSTURE: rounded shoulders, forward  head, and increased thoracic kyphosis. Increased fwd trunk flexion with prolonged time standing       LOWER EXTREMITY MMT:     MMT Right eval Left eval  Hip flexion 4+ 4+  Hip abduction (modified sitting) 5 5  Hip internal rotation 4 4  Hip external rotation 4 4  Knee flexion 5 5  Knee extension 5 5   (Blank rows = not tested)   Comments: Some crepitus with knee testing B but nonpainful per pt report - focal exam with good strength although generalized weakness apparent with functional transfers     FUNCTIONAL TESTS:  5xSTS:  20 sec - bilateral UE use FGA: 14/30      GAIT: Distance walked: within clinic Assistive device utilized: Single point cane Level of assistance: Modified independence (CGA during balance testing as above) Comments: step to/partial step through  pattern, SPC in RUE, fwd flexed posture, reduced arm swing B, reduced hip extension       TODAY'S TREATMENT  [facility] Adult PT Treatment:                                                DATE: 09/14/2022  Therapeutic Exercise: NuStep lvl 5 UE/LE x 5 min while taking subjective Step up fwd 4in 2x10 R leading Lateral walk 2.5# in // x 3 laps Standing hip abd/ext 2x10 each 2.5# Standing march 2x10 2.5#  Seated hamstring curl 3x10 BTB Therapeutic Activity: Assessment of tests/measures, goals, and outcomes for progress note  [facility] Adult PT Treatment:                                                DATE: 09/12/2022  Therapeutic Exercise: NuStep lvl 5 UE/LE x 5 min while taking subjective Step up fwd 4in 2x10 R leading Lateral walk 2.5# in // x 3 laps Standing hip abd/ext 2x10 each 2.5# Standing march 2x10 2.5#  LAQ 2x10 2.5#  Seated hamstring curl 3x10 BTB Supine SLR 3x10 each Supine quad set 2x10 - 5" hold Supne clamshell Black TB 3x15   [facility] Adult PT Treatment:                                                DATE: 09/06/2022  Therapeutic Exercise: NuStep lvl 6 UE/LE x 5 min while taking subjective STS x 10 - high table LAQ 2x10 2.5#  Seated march 2x20 2.5# Seated hamstring curl 3x10 GTB Supine SLR 3x10 each Supine quad set 2x10 - 5" hold Supne clamshell Black TB 3x15  Step up fwd 4in 2x10 R leading Lateral walk 2.5# at counter x 3 laps Standing hip abd/ext 2x10 each 2.5#  PATIENT EDUCATION:  Education details: continue HEP Person educated: Patient Education method: Explanation, Demonstration, Tactile cues, Verbal cues, and Handouts Education comprehension: verbalized understanding, returned demonstration, verbal cues required, tactile cues required, and needs further education      HOME EXERCISE PROGRAM: Access Code: [email or web address] URL: [email or web address] Date: 08/09/2022 Prepared by: Enis Slipper   Exercises - Sit to Stand with Counter Support  - 1 x daily - 7  x weekly -  3 sets - 5 reps - Standing Hip Extension with Counter Support  - 1 x daily - 7 x weekly - 3 sets - 10 reps   ASSESSMENT:   CLINICAL IMPRESSION: Pt was able to complete prescribed exercises with no adverse effect. Over the course of PT treatment he has progressed well, with improved subjective balance assessed via FOTO. He does still have continued deficits in functional mobility and balance, assessed via 5xSTS and FGA. He continues to benefit from skilled PT services, will continue to progress eccentric quad strength and balance as able.     OBJECTIVE IMPAIRMENTS Abnormal gait, decreased activity tolerance, decreased balance, decreased endurance, decreased mobility, difficulty walking, decreased strength, and postural dysfunction.    ACTIVITY LIMITATIONS sitting, standing, squatting, stairs, and transfers   PARTICIPATION LIMITATIONS: community activity   PERSONAL FACTORS Age are also affecting patient's functional outcome.      GOALS: Goals reviewed with patient? No   SHORT TERM GOALS: Target date: 08/30/2022   Pt will demonstrate appropriate understanding and performance of initially prescribed HEP in order to facilitate improved independence with management of symptoms.  Baseline: HEP provided on eval Goal status: MET    2. Therapist will assess FOTO at next visit and create appropriate long term goal based on score in order to reflect improved perception of function due to symptoms.            Baseline: Unable to access            Goal status: MET     LONG TERM GOALS: Target date: 10/27/2022     1.  Pt will demonstrate report ability to walk on even for up to 60 min in order to demonstrate improved safety/tolerance for community ambulation. Baseline: 25mn Goal status: ONGOING   2. Pt will score greater than or equal to 20/30 on Functional Gait assessment in order to indicate reduced fall risk (cutoff score </= 22/30 predictive of falls per WErnestine Conradet al 2010, MCID  4 pts Beninato et al 2014)            Baseline: 12/30 on eval 09/14/2022: 14/30            Goal status: INITIAL     3. Pt will perform 5xSTS in <14 sec in order to demonstrate reduced fall risk and improved functional independence. (MCID of 2.3sec)            Baseline: 19sec with assistive device and UE support (15sec without AD but unable to achieve full upright)            09/14/2022: 20 seconds            Goal status: ONGOING    4. Pt will improve FOTO function score to no less than 52% as proxy for functional improvement Baseline: 45% function 09/14/2022: 54% function Goal status: MET   PLAN: PT FREQUENCY: 2x/week   PT DURATION: 6 weeks   PLANNED INTERVENTIONS: Therapeutic exercises, Therapeutic activity, Neuromuscular re-education, Balance training, Gait training, Patient/Family education, Self Care, Stair training, Aquatic Therapy, Cryotherapy, Moist heat, Manual therapy, and Re-evaluation.   PLAN FOR NEXT SESSION: Review HEP, modify as needed. Progress postural stability exercises with emphasis on dual tasking, variable base of support, and variable surface compliance. Progress closed chain lower extremity strengthening as able/appropriate, would benefit from increased LE power output    DWard Chatters PT 09/14/2022, 11:29 AM

## 2022-09-14 ENCOUNTER — Ambulatory Visit: Payer: Medicare Other

## 2022-09-14 DIAGNOSIS — R2689 Other abnormalities of gait and mobility: Secondary | ICD-10-CM

## 2022-09-14 DIAGNOSIS — M6281 Muscle weakness (generalized): Secondary | ICD-10-CM | POA: Diagnosis not present

## 2022-09-14 DIAGNOSIS — R296 Repeated falls: Secondary | ICD-10-CM | POA: Diagnosis not present

## 2022-09-18 ENCOUNTER — Ambulatory Visit: Payer: Medicare Other

## 2022-09-18 DIAGNOSIS — R296 Repeated falls: Secondary | ICD-10-CM | POA: Diagnosis not present

## 2022-09-18 DIAGNOSIS — R2689 Other abnormalities of gait and mobility: Secondary | ICD-10-CM | POA: Diagnosis not present

## 2022-09-18 DIAGNOSIS — M6281 Muscle weakness (generalized): Secondary | ICD-10-CM | POA: Diagnosis not present

## 2022-09-18 NOTE — Therapy (Signed)
OUTPATIENT PHYSICAL THERAPY TREATMENT NOTE   Patient Name: Jorge Long MRN: [id number] DOB:[birth year], 88 y.o., male Today's Date: 09/18/2022  PCP: Maury Dus MD REFERRING PROVIDER: Olen Cordial, PA  END OF SESSION:   PT End of Session - 09/18/22 1125     Visit Number 10    Number of Visits 13    Date for PT Re-Evaluation 10/27/22    Authorization Type [employer] Medicare    Progress Note Due on Visit 10    PT Start Time 1130    PT Stop Time 1210    PT Time Calculation (min) 40 min    Equipment Utilized During Treatment Other (comment)   SPC   Activity Tolerance Patient tolerated treatment well    Behavior During Therapy [facility] for tasks assessed/performed                     Past Medical History:  Diagnosis Date   Aortic stenosis, moderate    Arrhythmia    BPH (benign prostatic hyperplasia)    Dr. Junious Silk, UROLOGY   Echocardiogram abnormal 04/03/2017   MILD TO MOD MR, MOD AV REGURG, MILD STENOSIS   GERD (gastroesophageal reflux disease)    Glaucoma    followed by Dr. 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   GERD (gastroesophageal reflux disease)    Hypertension    Thoracic aortic aneurysm (TAA) ([facility])    Glaucoma    Hypercholesteremia    Aortic stenosis, moderate    BPH (benign prostatic hyperplasia)    Echocardiogram abnormal 04/03/2017   Mass of left foot 02/26/2014    REFERRING DIAG: M62.81 (ICD-10-CM) - Muscle weakness (generalized)  THERAPY DIAG:  Other abnormalities of  gait and mobility  Repeated falls  Muscle weakness (generalized)  Rationale for Evaluation and Treatment Rehabilitation  PERTINENT HISTORY: Thoracic aortic aneurysm, HTN, GERD, abnormal echo  PRECAUTIONS: Fall  SUBJECTIVE: Pt presents to PT with no current knee pain, states he is feeling pretty good today. He has been compliant with HEP with no adverse effect. He is ready to begin PT at this time.   PAIN:  Are you having pain: Yes Location: Right Knee Rating: 2/10  OBJECTIVE: (objective measures completed at initial evaluation unless otherwise dated)  PATIENT SURVEYS:  FOTO: 45% function; 52% predicted   COGNITION:           Overall cognitive status: Within functional limits for tasks assessed                          SENSATION/NEURO: Light touch intact B LE, no apparent tremor or ataxia of extremities     POSTURE: rounded shoulders, forward head, and increased thoracic kyphosis. Increased fwd trunk flexion with prolonged time standing       LOWER EXTREMITY MMT:     MMT Right eval Left eval  Hip flexion 4+ 4+  Hip abduction (modified sitting) 5 5  Hip internal rotation 4 4  Hip external rotation 4 4  Knee flexion 5 5  Knee extension 5 5   (Blank rows = not tested)   Comments: Some crepitus with knee testing B but nonpainful per pt report - focal exam with good strength although generalized weakness apparent with functional transfers     FUNCTIONAL TESTS:  5xSTS:  20 sec - bilateral UE use FGA: 14/30      GAIT: Distance walked: within clinic Assistive device utilized: Single point cane Level of assistance: Modified independence (CGA during balance testing as above) Comments: step to/partial step through pattern, SPC in RUE, fwd flexed posture, reduced arm swing B, reduced hip extension       TODAY'S TREATMENT  [facility] Adult PT Treatment:                                                DATE: 09/18/2022  Therapeutic Exercise: NuStep lvl 5 UE/LE x 5 min  while taking subjective Seated hamstring curl 2x10 Blac TB Supine clamshell Black TB 3x15  Supine SLR 3x10 each Supine quad set 2x10 - 5" hold Step up fwd 4in 2x10 each Lateral walk x 3 laps at counter RTB Standing hip abd/ext 2x10 each RTB Semi-tandem stance  [facility] Adult PT Treatment:                                                DATE: 09/14/2022  Therapeutic Exercise: NuStep lvl 5 UE/LE x 5 min while taking subjective Step up fwd 4in 2x10 R leading Lateral walk 2.5# in // x 3 laps Standing hip abd/ext 2x10 each 2.5# Standing march 2x10 2.5#  Seated hamstring curl 3x10 BTB Therapeutic Activity: Assessment of tests/measures, goals, and outcomes for progress note  [facility] Adult PT Treatment:                                                DATE: 09/12/2022  Therapeutic Exercise: NuStep lvl 5 UE/LE x 5 min while taking subjective Step up fwd 4in 2x10 R leading Lateral walk 2.5# in // x 3 laps Standing hip abd/ext 2x10 each 2.5# Standing march 2x10 2.5#  LAQ 2x10 2.5#  Seated hamstring curl 3x10 BTB Supine SLR 3x10 each Supine quad set 2x10 - 5" hold Supne clamshell Black TB 3x15   [facility] Adult PT Treatment:                                                DATE: 09/06/2022  Therapeutic Exercise: NuStep lvl 6 UE/LE x 5 min while taking subjective STS x 10 - high table LAQ 2x10 2.5#  Seated march 2x20 2.5# Seated hamstring curl 3x10 GTB Supine SLR 3x10 each Supine quad set 2x10 - 5" hold Supne clamshell Black TB 3x15  Step up fwd 4in 2x10 R leading Lateral walk 2.5# at counter x 3 laps Standing hip abd/ext 2x10 each 2.5#  PATIENT EDUCATION:  Education details: continue HEP Person  educated: Patient Education method: Explanation, Demonstration, Tactile cues, Verbal cues, and Handouts Education comprehension: verbalized understanding, returned demonstration, verbal cues required, tactile cues required, and needs further education      HOME EXERCISE PROGRAM: Access Code:  [email or web address] URL: [email or web address] Date: 09/18/2022 Prepared by: Octavio Manns  Exercises - Sit to Stand with Counter Support  - 1 x daily - 7 x weekly - 3 sets - 5 reps - Standing Hip Extension with Counter Support  - 1 x daily - 7 x weekly - 3 sets - 10 reps - Seated Long Arc Quad  - 1 x daily - 7 x weekly - 2 sets - 10 reps - Hooklying Clamshell with Resistance  - 1 x daily - 7 x weekly - 2 sets - 10 reps - red theraband hold - Supine Hip Adduction Isometric with Ball  - 1 x daily - 7 x weekly - 2 sets - 10 reps - 5 seconds hold - Supine Bridge  - 1 x daily - 7 x weekly - 2 sets - 10 reps - Semi-Tandem Balance at [employer] Eyes Open  - 1 x daily - 7 x weekly - 2 reps - 302 sec hold   ASSESSMENT:   CLINICAL IMPRESSION: Pt was able to complete all prescribed exercises with no adverse effect or increase in pain. Therapy focused on improving LE strength and balance. He continues to progress with therapy, will continue per POC as prescribed.     OBJECTIVE IMPAIRMENTS Abnormal gait, decreased activity tolerance, decreased balance, decreased endurance, decreased mobility, difficulty walking, decreased strength, and postural dysfunction.    ACTIVITY LIMITATIONS sitting, standing, squatting, stairs, and transfers   PARTICIPATION LIMITATIONS: community activity   PERSONAL FACTORS Age are also affecting patient's functional outcome.      GOALS: Goals reviewed with patient? No   SHORT TERM GOALS: Target date: 08/30/2022   Pt will demonstrate appropriate understanding and performance of initially prescribed HEP in order to facilitate improved independence with management of symptoms.  Baseline: HEP provided on eval Goal status: MET    2. Therapist will assess FOTO at next visit and create appropriate long term goal based on score in order to reflect improved perception of function due to symptoms.            Baseline: Unable to access            Goal status: MET      LONG TERM GOALS: Target date: 10/27/2022     1.  Pt will demonstrate report ability to walk on even for up to 60 min in order to demonstrate improved safety/tolerance for community ambulation. Baseline: 76mn Goal status: ONGOING   2. Pt will score greater than or equal to 20/30 on Functional Gait assessment in order to indicate reduced fall risk (cutoff score </= 22/30 predictive of falls per WErnestine Conradet al 2010, MCID 4 pts Beninato et al 2014)            Baseline: 12/30 on eval 09/14/2022: 14/30            Goal status: INITIAL     3. Pt will perform 5xSTS in <14 sec in order to demonstrate reduced fall risk and improved functional independence. (MCID of 2.3sec)            Baseline: 19sec with assistive device and UE support (15sec without AD but unable to achieve full upright)            09/14/2022: 20 seconds  Goal status: ONGOING    4. Pt will improve FOTO function score to no less than 52% as proxy for functional improvement Baseline: 45% function 09/14/2022: 54% function Goal status: MET   PLAN: PT FREQUENCY: 2x/week   PT DURATION: 6 weeks   PLANNED INTERVENTIONS: Therapeutic exercises, Therapeutic activity, Neuromuscular re-education, Balance training, Gait training, Patient/Family education, Self Care, Stair training, Aquatic Therapy, Cryotherapy, Moist heat, Manual therapy, and Re-evaluation.   PLAN FOR NEXT SESSION: Review HEP, modify as needed. Progress postural stability exercises with emphasis on dual tasking, variable base of support, and variable surface compliance. Progress closed chain lower extremity strengthening as able/appropriate, would benefit from increased LE power output    Ward Chatters, PT 09/18/2022, 12:14 PM

## 2022-09-20 DIAGNOSIS — R3915 Urgency of urination: Secondary | ICD-10-CM | POA: Diagnosis not present

## 2022-09-25 DIAGNOSIS — H401132 Primary open-angle glaucoma, bilateral, moderate stage: Secondary | ICD-10-CM | POA: Diagnosis not present

## 2022-09-27 ENCOUNTER — Ambulatory Visit: Payer: Medicare Other | Attending: Physician Assistant

## 2022-09-27 DIAGNOSIS — M6281 Muscle weakness (generalized): Secondary | ICD-10-CM | POA: Diagnosis not present

## 2022-09-27 DIAGNOSIS — R296 Repeated falls: Secondary | ICD-10-CM | POA: Insufficient documentation

## 2022-09-27 DIAGNOSIS — R2689 Other abnormalities of gait and mobility: Secondary | ICD-10-CM | POA: Insufficient documentation

## 2022-09-27 NOTE — Therapy (Signed)
OUTPATIENT PHYSICAL THERAPY TREATMENT NOTE/DISCHARGE PHYSICAL THERAPY DISCHARGE SUMMARY  Visits from Start of Care: 11  Current functional level related to goals / functional outcomes: See goals and objective   Remaining deficits: See goals and objective   Education / Equipment: HEP   Patient agrees to discharge. Patient goals were  mostly met . Patient is being discharged due to being pleased with the current functional level.    Patient Name: Jorge Long MRN: [id number] DOB:29-Oct-1933, 88 y.o., male Today's Date: 09/27/2022  PCP: Maury Dus MD REFERRING PROVIDER: Olen Cordial, PA  END OF SESSION:   PT End of Session - 09/27/22 1040     Visit Number 11    Number of Visits 13    Date for PT Re-Evaluation 10/27/22    Authorization Type [employer]HC Medicare    Progress Note Due on Visit 10    PT Start Time 1043    PT Stop Time 1121    PT Time Calculation (min) 38 min    Equipment Utilized During Treatment Other (comment)   SPC   Activity Tolerance Patient tolerated treatment well    Behavior During Therapy WFL for tasks assessed/performed                      Past Medical History:  Diagnosis Date   Aortic stenosis, moderate    Arrhythmia    BPH (benign prostatic hyperplasia)    Dr. Junious Silk, UROLOGY   Echocardiogram abnormal 04/03/2017   MILD TO MOD MR, MOD AV REGURG, MILD STENOSIS   GERD (gastroesophageal reflux disease)    Glaucoma    followed by Dr. Lindalou Hose, [facility]   Heart murmur    Hypercholesteremia    PURE   Hypertension    Thoracic aortic aneurysm without rupture [facility])    Past Surgical History:  Procedure Laterality Date   APPENDECTOMY     EYE SURGERY     I & D EXTREMITY Left 03/02/2014   Procedure: Removal of Left foot mass;  Surgeon: Marianna Payment, MD;  Location: [place];  Service: Orthopedics;  Laterality: Left;   Patient Active Problem List   Diagnosis Date Noted   Idiopathic medial aortopathy and arteriopathy  ([place]) 06/09/2020   GERD (gastroesophageal reflux disease)    Hypertension    Thoracic aortic aneurysm (TAA) ([facility])    Glaucoma    Hypercholesteremia    Aortic stenosis, moderate    BPH (benign prostatic hyperplasia)    Echocardiogram abnormal 04/03/2017   Mass of left foot 02/26/2014    REFERRING DIAG: M62.81 (ICD-10-CM) - Muscle weakness (generalized)  THERAPY DIAG:  Other abnormalities of gait and mobility  Repeated falls  Muscle weakness (generalized)  Rationale for Evaluation and Treatment Rehabilitation  PERTINENT HISTORY: Thoracic aortic aneurysm, HTN, GERD, abnormal echo  PRECAUTIONS: Fall  SUBJECTIVE: Pt presents to PT with reports increase in R knee ain, believes it is due to weather. Has been compliant with HEP with no adverse effect. Is ready to begin PT at this time.   PAIN:  Are you having pain: Yes Location: Right Knee Rating: 5/10  OBJECTIVE: (objective measures completed at initial evaluation unless otherwise dated)  PATIENT SURVEYS:  FOTO: 45% function; 52% predicted   COGNITION:           Overall cognitive status: Within functional limits for tasks assessed  SENSATION/NEURO: Light touch intact B LE, no apparent tremor or ataxia of extremities     POSTURE: rounded shoulders, forward head, and increased thoracic kyphosis. Increased fwd trunk flexion with prolonged time standing       LOWER EXTREMITY MMT:     MMT Right eval Left eval  Hip flexion 4+ 4+  Hip abduction (modified sitting) 5 5  Hip internal rotation 4 4  Hip external rotation 4 4  Knee flexion 5 5  Knee extension 5 5   (Blank rows = not tested)   Comments: Some crepitus with knee testing B but nonpainful per pt report - focal exam with good strength although generalized weakness apparent with functional transfers     FUNCTIONAL TESTS:  5xSTS:  18 sec - bilateral UE use FGA: 14/30      GAIT: Distance walked: within clinic Assistive device  utilized: Single point cane Level of assistance: Modified independence (CGA during balance testing as above) Comments: step to/partial step through pattern, SPC in RUE, fwd flexed posture, reduced arm swing B, reduced hip extension       TODAY'S TREATMENT  [facility] Adult PT Treatment:                                                DATE: 09/27/2022  Therapeutic Exercise: NuStep lvl 5 UE/LE x 5 min while taking subjective Seated hamstring curl 2x10 Black TB Supine quad set x 10 - 5" hold Supine clamshell Black TB 3x15  Supine SLR 3x10 each Step up fwd 4in 2x10 each Lateral walk x 3 laps at counter RTB Standing hip abd/ext 2x10 each RTB Semi-tandem stance 2x30"  [facility] Adult PT Treatment:                                                DATE: 09/18/2022  Therapeutic Exercise: NuStep lvl 5 UE/LE x 5 min while taking subjective Seated hamstring curl 2x10 Blac TB Supine clamshell Black TB 3x15  Supine SLR 3x10 each Supine quad set 2x10 - 5" hold Step up fwd 4in 2x10 each Lateral walk x 3 laps at counter RTB Standing hip abd/ext 2x10 each RTB Semi-tandem stance  [facility] Adult PT Treatment:                                                DATE: 09/14/2022  Therapeutic Exercise: NuStep lvl 5 UE/LE x 5 min while taking subjective Step up fwd 4in 2x10 R leading Lateral walk 2.5# in // x 3 laps Standing hip abd/ext 2x10 each 2.5# Standing march 2x10 2.5#  Seated hamstring curl 3x10 BTB Therapeutic Activity: Assessment of tests/measures, goals, and outcomes for progress note  PATIENT EDUCATION:  Education details: continue HEP Person educated: Patient Education method: Explanation, Demonstration, Tactile cues, Verbal cues, and Handouts Education comprehension: verbalized understanding, returned demonstration, verbal cues required, tactile cues required, and needs further education      HOME EXERCISE PROGRAM: Access Code: [email or web address] URL: [email or web address] Date:  09/27/2022 Prepared by: Octavio Manns  Exercises - Sit to Stand with Counter Support  - 3-4 x weekly - 3  sets - 5 reps - Standing Hip Abduction with Resistance at Ankles and Counter Support  - 3-4 x weekly - 2 sets - 10 reps - Standing Hip Extension with Resistance at Ankles and Unilateral Counter Support  - 3-4 x weekly - 3 sets - 10 reps - Side Stepping with Resistance at Ankles and Counter Support  - 3-4 x weekly - 3 sets - 10 reps - Semi-Tandem Balance at [employer] Eyes Open  - 3-4 x weekly - 2 reps - 30 sec hold - Seated Long Arc Quad  - 3-4 x weekly - 2 sets - 10 reps - Hooklying Clamshell with Resistance  - 3-4 x weekly - 2 sets - 10 reps - red theraband hold - Supine Hip Adduction Isometric with Ball  - 3-4 x weekly - 2 sets - 10 reps - 5 seconds hold - Supine Bridge  - 3-4 x weekly - 2 sets - 10 reps   ASSESSMENT:   CLINICAL IMPRESSION: Pt was able to complete all prescribed exercises and demonstrated knowledge of HEP with no adverse effect. Over the course of PT treatment he has improved his strength and mobility while also improving balance. He has hit a plateau for functional improvement but does feel like he is in a good place for discharge as he feels good about HEP and overall improvement. Should continue to improve with HEP compliance, will continue to progress as able per POC.      OBJECTIVE IMPAIRMENTS Abnormal gait, decreased activity tolerance, decreased balance, decreased endurance, decreased mobility, difficulty walking, decreased strength, and postural dysfunction.    ACTIVITY LIMITATIONS sitting, standing, squatting, stairs, and transfers   PARTICIPATION LIMITATIONS: community activity   PERSONAL FACTORS Age are also affecting patient's functional outcome.      GOALS: Goals reviewed with patient? No   SHORT TERM GOALS: Target date: 08/30/2022   Pt will demonstrate appropriate understanding and performance of initially prescribed HEP in order to facilitate  improved independence with management of symptoms.  Baseline: HEP provided on eval Goal status: MET    2. Therapist will assess FOTO at next visit and create appropriate long term goal based on score in order to reflect improved perception of function due to symptoms.            Baseline: Unable to access            Goal status: MET     LONG TERM GOALS: Target date: 10/27/2022     1.  Pt will demonstrate report ability to walk on even for up to 60 min in order to demonstrate improved safety/tolerance for community ambulation. Baseline: 22mn Goal status: ONGOING   2. Pt will score greater than or equal to 20/30 on Functional Gait assessment in order to indicate reduced fall risk (cutoff score </= 22/30 predictive of falls per WErnestine Conradet al 2010, MCID 4 pts Beninato et al 2014)            Baseline: 12/30 on eval 09/14/2022: 14/30 09/27/2022: 14/30            Goal status: NOT MET 3. Pt will perform 5xSTS in <14 sec in order to demonstrate reduced fall risk and improved functional independence. (MCID of 2.3sec)            Baseline: 19sec with assistive device and UE support (15sec without AD but unable to achieve full upright)            09/14/2022: 20 seconds  11/1/20223: 18 seconds  Goal status: NOT MET BUT IMPROVED    4. Pt will improve FOTO function score to no less than 52% as proxy for functional improvement Baseline: 45% function 09/14/2022: 54% function Goal status: MET   PLAN: PT FREQUENCY: 2x/week   PT DURATION: 6 weeks   PLANNED INTERVENTIONS: Therapeutic exercises, Therapeutic activity, Neuromuscular re-education, Balance training, Gait training, Patient/Family education, Self Care, Stair training, Aquatic Therapy, Cryotherapy, Moist heat, Manual therapy, and Re-evaluation.   PLAN FOR NEXT SESSION: Review HEP, modify as needed. Progress postural stability exercises with emphasis on dual tasking, variable base of support, and variable surface compliance.  Progress closed chain lower extremity strengthening as able/appropriate, would benefit from increased LE power output    Ward Chatters, PT 09/27/2022, 11:25 AM

## 2022-11-21 DIAGNOSIS — H6123 Impacted cerumen, bilateral: Secondary | ICD-10-CM | POA: Diagnosis not present

## 2022-11-21 DIAGNOSIS — H903 Sensorineural hearing loss, bilateral: Secondary | ICD-10-CM | POA: Diagnosis not present

## 2023-01-04 DIAGNOSIS — Z0000 Encounter for general adult medical examination without abnormal findings: Secondary | ICD-10-CM | POA: Diagnosis not present

## 2023-01-04 DIAGNOSIS — I2510 Atherosclerotic heart disease of native coronary artery without angina pectoris: Secondary | ICD-10-CM | POA: Diagnosis not present

## 2023-01-04 DIAGNOSIS — H40019 Open angle with borderline findings, low risk, unspecified eye: Secondary | ICD-10-CM | POA: Diagnosis not present

## 2023-01-04 DIAGNOSIS — I10 Essential (primary) hypertension: Secondary | ICD-10-CM | POA: Diagnosis not present

## 2023-01-04 DIAGNOSIS — I700 Atherosclerosis of aorta: Secondary | ICD-10-CM | POA: Diagnosis not present

## 2023-01-04 DIAGNOSIS — Z23 Encounter for immunization: Secondary | ICD-10-CM | POA: Diagnosis not present

## 2023-01-04 DIAGNOSIS — I712 Thoracic aortic aneurysm, without rupture, unspecified: Secondary | ICD-10-CM | POA: Diagnosis not present

## 2023-01-04 DIAGNOSIS — E7800 Pure hypercholesterolemia, unspecified: Secondary | ICD-10-CM | POA: Diagnosis not present

## 2023-01-31 ENCOUNTER — Ambulatory Visit (INDEPENDENT_AMBULATORY_CARE_PROVIDER_SITE_OTHER): Payer: Medicare Other | Admitting: Podiatry

## 2023-01-31 DIAGNOSIS — B351 Tinea unguium: Secondary | ICD-10-CM | POA: Diagnosis not present

## 2023-01-31 NOTE — Progress Notes (Signed)
Subjective:  Patient ID: Jorge Long, male    DOB: 11/19/1933,  MRN: [id number]  Chief Complaint  Patient presents with   Nail Problem    89 y.o. male presents with the above complaint.  Patient presents with left hallux thickened discolored mycotic toenails x 1.  Patient states not bothersome he wanted to discuss treatment options for his tried topical medication.  He denies any other acute complaints.   Review of Systems: Negative except as noted in the HPI. Denies N/V/F/Ch.  Past Medical History:  Diagnosis Date   Aortic stenosis, moderate    Arrhythmia    BPH (benign prostatic hyperplasia)    Dr. Junious Silk, UROLOGY   Echocardiogram abnormal 04/03/2017   MILD TO MOD MR, MOD AV REGURG, MILD STENOSIS   GERD (gastroesophageal reflux disease)    Glaucoma    followed by Dr. Lindalou Hose, [facility]   Heart murmur    Hypercholesteremia    PURE   Hypertension    Thoracic aortic aneurysm without rupture [facility])     Current Outpatient Medications:    amLODipine (NORVASC) 5 MG tablet, Take 5 mg by mouth daily., Disp: , Rfl:    finasteride (PROSCAR) 5 MG tablet, Take 5 mg by mouth daily., Disp: , Rfl:    iron polysaccharides (NIFEREX) 150 MG capsule, Take 150 mg by mouth daily., Disp: , Rfl:    latanoprost (XALATAN) 0.005 % ophthalmic solution, , Disp: , Rfl:    lisinopril (PRINIVIL,ZESTRIL) 20 MG tablet, Take 20 mg by mouth daily., Disp: , Rfl:    meloxicam (MOBIC) 15 MG tablet, Take 15 mg by mouth daily., Disp: , Rfl:    metoprolol succinate (TOPROL-XL) 25 MG 24 hr tablet, Take 25 mg by mouth daily., Disp: , Rfl:    mupirocin ointment (BACTROBAN) 2 %, APPLY TO AFFECTED AREA TWICE A DAY, Disp: 22 g, Rfl: 12   neomycin-polymyxin-hydrocortisone (CORTISPORIN) OTIC solution, Apply 1-2 drops to toes twice a day, Disp: 10 mL, Rfl: 0   ranitidine (ZANTAC) 300 MG tablet, Take 300 mg by mouth at bedtime., Disp: , Rfl:    travoprost, benzalkonium, (TRAVATAN) 0.004 % ophthalmic solution,  1 drop at bedtime., Disp: , Rfl:    triamcinolone cream (KENALOG) 0.1 %, Apply 1 application topically 2 (two) times daily as needed., Disp: , Rfl:   Social History   Tobacco Use  Smoking Status Never  Smokeless Tobacco Never    No Known Allergies Objective:  There were no vitals filed for this visit. There is no height or weight on file to calculate BMI. Constitutional Well developed. Well nourished.  Vascular Dorsalis pedis pulses palpable bilaterally. Posterior tibial pulses palpable bilaterally. Capillary refill normal to all digits.  No cyanosis or clubbing noted. Pedal hair growth normal.  Neurologic Normal speech. Oriented to person, place, and time. Epicritic sensation to light touch grossly present bilaterally.  Dermatologic Nails left hallux thickened elongated dystrophic mycotic toenails x 1 mild pain on palpation Skin within normal limits  Orthopedic: Normal joint ROM without pain or crepitus bilaterally. No visible deformities. No bony tenderness.   Radiographs: None Assessment:   1. Nail fungus   2. Onychomycosis due to dermatophyte    Long:  Patient was evaluated and treated and all questions answered.  Left hallux onychomycosis -Educated the patient on the etiology of onychomycosis and various treatment options associated with improving the fungal load.  I explained to the patient that there is 3 treatment options available to treat the onychomycosis including topical, p.o., laser  treatment.  Patient would like to hold off on treatment.  He will continue topical medication for now.  If any foot and ankle issues on future he will come back and see me.   No follow-ups on file.

## 2023-02-22 DIAGNOSIS — M25562 Pain in left knee: Secondary | ICD-10-CM | POA: Diagnosis not present

## 2023-02-22 DIAGNOSIS — M170 Bilateral primary osteoarthritis of knee: Secondary | ICD-10-CM | POA: Diagnosis not present

## 2023-02-22 DIAGNOSIS — M25561 Pain in right knee: Secondary | ICD-10-CM | POA: Diagnosis not present

## 2023-02-22 DIAGNOSIS — R262 Difficulty in walking, not elsewhere classified: Secondary | ICD-10-CM | POA: Diagnosis not present

## 2023-03-01 ENCOUNTER — Ambulatory Visit: Payer: Medicare Other | Admitting: Podiatry

## 2023-03-02 DIAGNOSIS — M25562 Pain in left knee: Secondary | ICD-10-CM | POA: Diagnosis not present

## 2023-03-02 DIAGNOSIS — M25561 Pain in right knee: Secondary | ICD-10-CM | POA: Diagnosis not present

## 2023-03-02 DIAGNOSIS — R262 Difficulty in walking, not elsewhere classified: Secondary | ICD-10-CM | POA: Diagnosis not present

## 2023-03-02 DIAGNOSIS — M170 Bilateral primary osteoarthritis of knee: Secondary | ICD-10-CM | POA: Diagnosis not present

## 2023-03-09 DIAGNOSIS — R262 Difficulty in walking, not elsewhere classified: Secondary | ICD-10-CM | POA: Diagnosis not present

## 2023-03-09 DIAGNOSIS — M25561 Pain in right knee: Secondary | ICD-10-CM | POA: Diagnosis not present

## 2023-03-09 DIAGNOSIS — M25562 Pain in left knee: Secondary | ICD-10-CM | POA: Diagnosis not present

## 2023-03-09 DIAGNOSIS — M170 Bilateral primary osteoarthritis of knee: Secondary | ICD-10-CM | POA: Diagnosis not present

## 2023-03-16 DIAGNOSIS — R262 Difficulty in walking, not elsewhere classified: Secondary | ICD-10-CM | POA: Diagnosis not present

## 2023-03-16 DIAGNOSIS — M25561 Pain in right knee: Secondary | ICD-10-CM | POA: Diagnosis not present

## 2023-03-16 DIAGNOSIS — M170 Bilateral primary osteoarthritis of knee: Secondary | ICD-10-CM | POA: Diagnosis not present

## 2023-03-16 DIAGNOSIS — M25562 Pain in left knee: Secondary | ICD-10-CM | POA: Diagnosis not present

## 2023-03-19 DIAGNOSIS — H401132 Primary open-angle glaucoma, bilateral, moderate stage: Secondary | ICD-10-CM | POA: Diagnosis not present

## 2023-03-23 DIAGNOSIS — R262 Difficulty in walking, not elsewhere classified: Secondary | ICD-10-CM | POA: Diagnosis not present

## 2023-03-23 DIAGNOSIS — M25562 Pain in left knee: Secondary | ICD-10-CM | POA: Diagnosis not present

## 2023-03-23 DIAGNOSIS — M25561 Pain in right knee: Secondary | ICD-10-CM | POA: Diagnosis not present

## 2023-03-23 DIAGNOSIS — M170 Bilateral primary osteoarthritis of knee: Secondary | ICD-10-CM | POA: Diagnosis not present

## 2023-05-02 ENCOUNTER — Ambulatory Visit: Payer: Medicare Other | Admitting: Podiatry

## 2023-05-02 DIAGNOSIS — L603 Nail dystrophy: Secondary | ICD-10-CM

## 2023-05-02 NOTE — Progress Notes (Signed)
Subjective:  Patient ID: Jorge Long, male    DOB: Oct 28, 1933,  MRN: [id number]  Chief Complaint  Patient presents with   Nail Problem    Rm 12 Left great nail fungus. Pt states his nail came off in march and since he has had a small black spot that will not go away. Pt states he has tried otc antifungals ointment.     89 y.o. male presents with the above complaint.  Patient presents with mechanical dystrophic mycotic nail left great toe.  Patient would like to have removed by permanent.  He states that about the same way he denies any other acute complaints.   Review of Systems: Negative except as noted in the HPI. Denies N/V/F/Ch.  Past Medical History:  Diagnosis Date   Aortic stenosis, moderate    Arrhythmia    BPH (benign prostatic hyperplasia)    Dr. Mena Goes, UROLOGY   Echocardiogram abnormal 04/03/2017   MILD TO MOD MR, MOD AV REGURG, MILD STENOSIS   GERD (gastroesophageal reflux disease)    Glaucoma    followed by Dr. Theodis Aguas, [facility]   Heart murmur    Hypercholesteremia    PURE   Hypertension    Thoracic aortic aneurysm without rupture [facility])     Current Outpatient Medications:    amLODipine (NORVASC) 5 MG tablet, Take 5 mg by mouth daily., Disp: , Rfl:    finasteride (PROSCAR) 5 MG tablet, Take 5 mg by mouth daily., Disp: , Rfl:    iron polysaccharides (NIFEREX) 150 MG capsule, Take 150 mg by mouth daily., Disp: , Rfl:    latanoprost (XALATAN) 0.005 % ophthalmic solution, , Disp: , Rfl:    lisinopril (PRINIVIL,ZESTRIL) 20 MG tablet, Take 20 mg by mouth daily., Disp: , Rfl:    meloxicam (MOBIC) 15 MG tablet, Take 15 mg by mouth daily., Disp: , Rfl:    metoprolol succinate (TOPROL-XL) 25 MG 24 hr tablet, Take 25 mg by mouth daily., Disp: , Rfl:    mupirocin ointment (BACTROBAN) 2 %, APPLY TO AFFECTED AREA TWICE A DAY, Disp: 22 g, Rfl: 12   neomycin-polymyxin-hydrocortisone (CORTISPORIN) OTIC solution, Apply 1-2 drops to toes twice a day, Disp: 10 mL,  Rfl: 0   ranitidine (ZANTAC) 300 MG tablet, Take 300 mg by mouth at bedtime., Disp: , Rfl:    travoprost, benzalkonium, (TRAVATAN) 0.004 % ophthalmic solution, 1 drop at bedtime., Disp: , Rfl:    triamcinolone cream (KENALOG) 0.1 %, Apply 1 application topically 2 (two) times daily as needed., Disp: , Rfl:   Social History   Tobacco Use  Smoking Status Never  Smokeless Tobacco Never    No Known Allergies Objective:  There were no vitals filed for this visit. There is no height or weight on file to calculate BMI. Constitutional Well developed. Well nourished.  Vascular Dorsalis pedis pulses palpable bilaterally. Posterior tibial pulses palpable bilaterally. Capillary refill normal to all digits.  No cyanosis or clubbing noted. Pedal hair growth normal.  Neurologic Normal speech. Oriented to person, place, and time. Epicritic sensation to light touch grossly present bilaterally.  Dermatologic Pain on palpation of the entire/total nail on 1st digit of the left No other open wounds. No skin lesions.  Orthopedic: Normal joint ROM without pain or crepitus bilaterally. No visible deformities. No bony tenderness.   Radiographs: None Assessment:   1. Nail dystrophy    Plan:  Patient was evaluated and treated and all questions answered.  Nail contusion/dystrophy left hallux nail, left -Patient elects  to proceed with minor surgery to remove entire toenail today. Consent reviewed and signed by patient. -Entire/total nail excised. See procedure note. -Educated on post-procedure care including soaking. Written instructions provided and reviewed. -Patient to follow up in 2 weeks for nail check.  Procedure: Excision of entire/total nail with phenol matricectomy Location: Left 1st toe digit Anesthesia: Lidocaine 1% plain; 1.5 mL and Marcaine 0.5% plain; 1.5 mL, digital block. Skin Prep: Betadine. Dressing: Silvadene; telfa; dry, sterile, compression dressing. Technique: Following  skin prep, the toe was exsanguinated and a tourniquet was secured at the base of the toe. The affected nail border was freed and excised.  With phenol matricectomy performed in standard technique the tourniquet was then removed and sterile dressing applied. Disposition: Patient tolerated procedure well. Patient to return in 2 weeks for follow-up.   No follow-ups on file.

## 2023-05-02 NOTE — Patient Instructions (Signed)

## 2023-05-03 ENCOUNTER — Other Ambulatory Visit: Payer: Self-pay | Admitting: Cardiothoracic Surgery

## 2023-05-03 DIAGNOSIS — I712 Thoracic aortic aneurysm, without rupture, unspecified: Secondary | ICD-10-CM

## 2023-06-01 ENCOUNTER — Other Ambulatory Visit: Payer: Self-pay | Admitting: Physician Assistant

## 2023-06-04 ENCOUNTER — Ambulatory Visit
Admission: RE | Admit: 2023-06-04 | Discharge: 2023-06-04 | Disposition: A | Discharge status: Home / Self Care | Payer: Medicare Other | Source: Ambulatory Visit | Attending: Cardiothoracic Surgery | Admitting: Cardiothoracic Surgery

## 2023-06-04 ENCOUNTER — Encounter: Payer: Self-pay | Admitting: Cardiothoracic Surgery

## 2023-06-04 ENCOUNTER — Ambulatory Visit: Payer: Medicare Other | Admitting: Cardiothoracic Surgery

## 2023-06-04 VITALS — BP 147/87 | HR 83 | Resp 18 | Ht 64.0 in

## 2023-06-04 DIAGNOSIS — I7123 Aneurysm of the descending thoracic aorta, without rupture: Secondary | ICD-10-CM | POA: Diagnosis not present

## 2023-06-04 DIAGNOSIS — I7121 Aneurysm of the ascending aorta, without rupture: Secondary | ICD-10-CM

## 2023-06-04 DIAGNOSIS — I2510 Atherosclerotic heart disease of native coronary artery without angina pectoris: Secondary | ICD-10-CM | POA: Diagnosis not present

## 2023-06-04 DIAGNOSIS — I712 Thoracic aortic aneurysm, without rupture, unspecified: Secondary | ICD-10-CM

## 2023-06-04 MED ORDER — IOPAMIDOL (ISOVUE-370) INJECTION 76%
75.0000 mL | Freq: Once | INTRAVENOUS | Status: AC | PRN
  Administered 2023-06-04: 75 mL via INTRAVENOUS

## 2023-06-04 NOTE — Progress Notes (Signed)
HPI: The patient returns for scheduled follow-up and surveillance CT scan for a stable asymptomatic 4.5 cm fusiform ascending aneurysm.  Was first noted in 2015 and has remained stable.  The patient is a non-smoker.  He has well-controlled blood pressure and he is compliant with his blood pressure medications including Norvasc lisinopril and Toprol-XL.  Patient denies any chest pain or shortness of breath. 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  He has had some surgery on the left foot by his podiatrist.  Current Outpatient Medications  Medication Sig Dispense Refill   amLODipine (NORVASC) 5 MG tablet Take 5 mg by mouth daily.     finasteride (PROSCAR) 5 MG tablet Take 5 mg by mouth daily.     iron polysaccharides (NIFEREX) 150 MG capsule Take 150 mg by mouth daily.     latanoprost (XALATAN) 0.005 % ophthalmic solution      lisinopril (PRINIVIL,ZESTRIL) 20 MG tablet Take 20 mg by mouth daily.     meloxicam (MOBIC) 15 MG tablet Take 15 mg by mouth daily.     metoprolol succinate (TOPROL-XL) 25 MG 24 hr tablet Take 25 mg by mouth daily.     mupirocin ointment (BACTROBAN) 2 % APPLY TO AFFECTED AREA TWICE A DAY 22 g 12   neomycin-polymyxin-hydrocortisone (CORTISPORIN) OTIC solution Apply 1-2 drops to toes twice a day 10 mL 0   ranitidine (ZANTAC) 300 MG tablet Take 300 mg by mouth at bedtime.     travoprost, benzalkonium, (TRAVATAN) 0.004 % ophthalmic solution 1 drop at bedtime.     triamcinolone cream (KENALOG) 0.1 % Apply 1 application topically 2 (two) times daily as needed.     No current facility-administered medications for this visit.     Physical Exam: Vitals:   06/04/23 1134  BP: (!) 147/87  Pulse: 83  Resp: 18  SpO2: 96%        Exam    General- alert and comfortable    Neck- no JVD, no cervical adenopathy palpable, no carotid bruit   Lungs- clear without rales, wheezes   Cor- regular rate and rhythm, 1/6  systolic murmur , no gallop   Abdomen- soft, non-tender   Extremities - warm, non-tender, minimal edema   Neuro- oriented, appropriate, no focal weakness, general weakness   Diagnostic Tests: CT scan of the thoracic aorta personally reviewed showing stable 4.5 cm fusiform aneurysm.  No evidence of intramural hematoma or penetrating ulcer.  Impression: Stable asymptomatic fusiform ascending aneurysm.  Patient does not meet criteria for surgery and would be a very poor candidate for a major cardiac procedure due to advanced age.  Best therapy is good blood pressure control and surveillance scanning and he will return in a year for follow-up.  Plan: Return in 12-18 months with repeat CT scan of chest to follow stable moderate ascending thoracic aneurysm.   Lovett Sox, MD [facility] [phone]

## 2023-07-25 ENCOUNTER — Ambulatory Visit (INDEPENDENT_AMBULATORY_CARE_PROVIDER_SITE_OTHER): Payer: Medicare Other

## 2023-07-25 ENCOUNTER — Encounter (HOSPITAL_COMMUNITY): Payer: Self-pay | Admitting: Emergency Medicine

## 2023-07-25 ENCOUNTER — Ambulatory Visit (HOSPITAL_COMMUNITY)
Admission: EM | Admit: 2023-07-25 | Discharge: 2023-07-25 | Disposition: A | Discharge status: Home / Self Care | Payer: Medicare Other | Attending: Family Medicine | Admitting: Family Medicine

## 2023-07-25 DIAGNOSIS — R0789 Other chest pain: Secondary | ICD-10-CM | POA: Diagnosis not present

## 2023-07-25 DIAGNOSIS — M25571 Pain in right ankle and joints of right foot: Secondary | ICD-10-CM

## 2023-07-25 DIAGNOSIS — I771 Stricture of artery: Secondary | ICD-10-CM | POA: Diagnosis not present

## 2023-07-25 DIAGNOSIS — J90 Pleural effusion, not elsewhere classified: Secondary | ICD-10-CM | POA: Diagnosis not present

## 2023-07-25 DIAGNOSIS — M79604 Pain in right leg: Secondary | ICD-10-CM

## 2023-07-25 DIAGNOSIS — M79661 Pain in right lower leg: Secondary | ICD-10-CM | POA: Diagnosis not present

## 2023-07-25 DIAGNOSIS — I10 Essential (primary) hypertension: Secondary | ICD-10-CM

## 2023-07-25 DIAGNOSIS — M1711 Unilateral primary osteoarthritis, right knee: Secondary | ICD-10-CM | POA: Diagnosis not present

## 2023-07-25 DIAGNOSIS — S2020XA Contusion of thorax, unspecified, initial encounter: Secondary | ICD-10-CM | POA: Diagnosis not present
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
## 2023-07-25 NOTE — Discharge Instructions (Addendum)
Your x-rays did not show any broken bones.  If not allergic, you may use over the counter ibuprofen or acetaminophen as needed.  Your blood pressure was noted to be elevated during your visit today. If you are currently taking medication for high blood pressure, please ensure you are taking this as directed. If you do not have a history of high blood pressure and your blood pressure remains persistently elevated, you may need to begin taking a medication at some point. You may return here within the next few days to recheck if unable to see your primary care provider or if you do not have a one.  BP (!) 178/89 (BP Location: Left Arm)   Pulse 77   Temp 98.2 F (36.8 C) (Oral)   Resp 16   SpO2 96%   BP Readings from Last 3 Encounters:  07/25/23 (!) 178/89  06/04/23 (!) 147/87  12/26/21 (!) 152/72
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
## 2024-01-07 DIAGNOSIS — Z23 Encounter for immunization: Secondary | ICD-10-CM | POA: Diagnosis not present

## 2024-01-07 DIAGNOSIS — I712 Thoracic aortic aneurysm, without rupture, unspecified: Secondary | ICD-10-CM | POA: Diagnosis not present

## 2024-01-07 DIAGNOSIS — Z0000 Encounter for general adult medical examination without abnormal findings: Secondary | ICD-10-CM | POA: Diagnosis not present

## 2024-01-07 DIAGNOSIS — I350 Nonrheumatic aortic (valve) stenosis: Secondary | ICD-10-CM | POA: Diagnosis not present

## 2024-01-07 DIAGNOSIS — L309 Dermatitis, unspecified: Secondary | ICD-10-CM | POA: Diagnosis not present

## 2024-01-07 DIAGNOSIS — K219 Gastro-esophageal reflux disease without esophagitis: Secondary | ICD-10-CM | POA: Diagnosis not present

## 2024-01-07 DIAGNOSIS — M898X6 Other specified disorders of bone, lower leg: Secondary | ICD-10-CM | POA: Diagnosis not present

## 2024-01-07 DIAGNOSIS — E7800 Pure hypercholesterolemia, unspecified: Secondary | ICD-10-CM | POA: Diagnosis not present

## 2024-01-07 DIAGNOSIS — D509 Iron deficiency anemia, unspecified: Secondary | ICD-10-CM | POA: Diagnosis not present

## 2024-01-07 DIAGNOSIS — I10 Essential (primary) hypertension: Secondary | ICD-10-CM | POA: Diagnosis not present

## 2024-01-08 ENCOUNTER — Other Ambulatory Visit: Payer: Self-pay | Admitting: Physician Assistant

## 2024-01-08 DIAGNOSIS — I712 Thoracic aortic aneurysm, without rupture, unspecified: Secondary | ICD-10-CM

## 2024-01-18 ENCOUNTER — Ambulatory Visit: Payer: Medicare Other | Admitting: Podiatry

## 2024-02-04 ENCOUNTER — Ambulatory Visit
Admission: RE | Admit: 2024-02-04 | Discharge: 2024-02-04 | Disposition: A | Discharge status: Home / Self Care | Payer: Medicare Other | Source: Ambulatory Visit | Attending: Physician Assistant | Admitting: Physician Assistant

## 2024-02-04 DIAGNOSIS — I700 Atherosclerosis of aorta: Secondary | ICD-10-CM | POA: Diagnosis not present

## 2024-02-04 DIAGNOSIS — K7689 Other specified diseases of liver: Secondary | ICD-10-CM | POA: Diagnosis not present

## 2024-02-04 DIAGNOSIS — I712 Thoracic aortic aneurysm, without rupture, unspecified: Secondary | ICD-10-CM | POA: Diagnosis not present

## 2024-02-04 MED ORDER — IOPAMIDOL (ISOVUE-370) INJECTION 76%
75.0000 mL | Freq: Once | INTRAVENOUS | Status: AC | PRN
  Administered 2024-02-04: 75 mL via INTRAVENOUS

## 2024-02-15 ENCOUNTER — Ambulatory Visit (INDEPENDENT_AMBULATORY_CARE_PROVIDER_SITE_OTHER)

## 2024-02-15 ENCOUNTER — Ambulatory Visit: Payer: Medicare Other | Admitting: Podiatry

## 2024-02-15 DIAGNOSIS — Z01818 Encounter for other preprocedural examination: Secondary | ICD-10-CM | POA: Diagnosis not present

## 2024-02-15 DIAGNOSIS — M898X9 Other specified disorders of bone, unspecified site: Secondary | ICD-10-CM | POA: Diagnosis not present

## 2024-02-15 NOTE — Progress Notes (Signed)
 Subjective:  Patient ID: Jorge Long, male    DOB: 01/04/1933,  MRN: [id number]  Chief Complaint  Patient presents with   Callouses    89 y.o. male presents with the above complaint.  Patient presents with left fifth metatarsal base exostectomy cysts.  He states is been present for quite some time is progressive gotten worse worse with ambulation worse with pressure he would like to discuss surgical options at this time.  Shaving it down to his bottom some time he is failed all conservative care including including multiple debridement and shoe gear modification padding protecting offloading he would like to discuss surgical options at this time   Review of Systems: Negative except as noted in the HPI. Denies N/V/F/Ch.  Past Medical History:  Diagnosis Date   Aortic stenosis, moderate    Arrhythmia    BPH (benign prostatic hyperplasia)    Dr. Mena Goes, UROLOGY   Echocardiogram abnormal 04/03/2017   MILD TO MOD MR, MOD AV REGURG, MILD STENOSIS   GERD (gastroesophageal reflux disease)    Glaucoma    follow[facility] by Dr. Theodis Aguas, [facility]   Heart murmur    Hypercholesteremia    PURE   Hypertension    Thoracic aortic aneurysm without rupture [facility])     Current Outpatient Medications:    amLODipine (NORVASC) 5 MG tablet, Take 5 mg by mouth daily., Disp: , Rfl:    finasteride (PROSCAR) 5 MG tablet, Take 5 mg by mouth daily., Disp: , Rfl:    iron polysaccharides (NIFEREX) 150 MG capsule, Take 150 mg by mouth daily., Disp: , Rfl:    latanoprost (XALATAN) 0.005 % ophthalmic solution, , Disp: , Rfl:    lisinopril (PRINIVIL,ZESTRIL) 20 MG tablet, Take 20 mg by mouth daily., Disp: , Rfl:    meloxicam (MOBIC) 15 MG tablet, Take 15 mg by mouth daily., Disp: , Rfl:    metoprolol succinate (TOPROL-XL) 25 MG 24 hr tablet, Take 25 mg by mouth daily., Disp: , Rfl:    mupirocin ointment (BACTROBAN) 2 %, APPLY TO AFFECTED AREA TWICE A DAY, Disp: 22 g, Rfl: 12    neomycin-polymyxin-hydrocortisone (CORTISPORIN) OTIC solution, Apply 1-2 drops to toes twice a day, Disp: 10 mL, Rfl: 0   ranitidine (ZANTAC) 300 MG tablet, Take 300 mg by mouth at bedtime., Disp: , Rfl:    travoprost, benzalkonium, (TRAVATAN) 0.004 % ophthalmic solution, 1 drop at bedtime., Disp: , Rfl:    triamcinolone cream (KENALOG) 0.1 %, Apply 1 application topically 2 (two) times daily as needed., Disp: , Rfl:   Social History   Tobacco Use  Smoking Status Never  Smokeless Tobacco Never    No Known Allergies Objective:  There were no vitals filed for this visit. There is no height or weight on file to calculate BMI. Constitutional Well developed. Well nourished.  Vascular Dorsalis pedis pulses palpable bilaterally. Posterior tibial pulses palpable bilaterally. Capillary refill normal to all digits.  No cyanosis or clubbing noted. Pedal hair growth normal.  Neurologic Normal speech. Oriented to person, place, and time. Epicritic sensation to light touch grossly present bilaterally.  Dermatologic Nails well groomed and normal in appearance. No open wounds. No skin lesions.  Orthopedic: Left fifth metatarsal base exostosis noted pain on palpation.  No open wounds or lesion noted   Radiographs: 3 views of skeletally show adult left foot: Fifth metatarsal base exostosis noted.  Midfoot arthritis noted posterior and plantar heel spurring noted no other abnormalities identified Assessment:   1. Bony exostosis  Plan:  Patient was evaluated and treated and all questions answered.  Left fifth metatarsal base bony exostosis -All questions and concerns were discussed with the patient in extensive detail given the amount of excess was not present in setting of failed conservative care listed above believe patient will benefit from exostectomy of the fifth metatarsal base partial to help reduce the pressure and therefore his pain I discussed with the patient I discussed my  preoperative intra postop plan with the patient extensive detail he states understand like to proceed with surgery -Informed surgical risk consent was reviewed and read aloud to the patient.  I reviewed the films.  I have discussed my findings with the patient in great detail.  I have discussed all risks including but not limited to infection, stiffness, scarring, limp, disability, deformity, damage to blood vessels and nerves, numbness, poor healing, need for braces, arthritis, chronic pain, amputation, death.  All benefits and realistic expectations discussed in great detail.  I have made no promises as to the outcome.  I have provided realistic expectations.  I have offered the patient a 2nd opinion, which they have declined and assured me they preferred to proceed despite the risks   No follow-ups on file.

## 2024-02-18 ENCOUNTER — Telehealth: Payer: Self-pay | Admitting: Podiatry

## 2024-02-18 NOTE — Telephone Encounter (Signed)
 Pt called and is scheduled for surgery 03/17/24 but he has questions about the surgery. He is wondering how long he will be in the boot and not able to put weight on foot. He lives with his daughter and is just concerned about the recovery. Is he to wear boot 24/7 etc.

## 2024-02-19 NOTE — Telephone Encounter (Signed)
 Called pt and gave him the information about when he should wear the boot and for how long and he said thank you for calling.

## 2024-02-26 ENCOUNTER — Telehealth: Payer: Self-pay | Admitting: Podiatry

## 2024-02-26 NOTE — Telephone Encounter (Signed)
 Pt left message stating he has some questions about his surgery on 03/17/24.  Returned call and he was asking if the surgery could be a little later not at 800am. I told him the surgery center schedules the surgery time but he could discuss with them when they call him.  He also asked about his medication and I told him to look in the folder he was given and it has a list of medications he would need to stop. If he has questions about stopping them he would need to contact the provider that prescribes the medication.

## 2024-02-29 ENCOUNTER — Telehealth: Payer: Self-pay | Admitting: Podiatry

## 2024-02-29 NOTE — Telephone Encounter (Signed)
 DOS: 03/17/24  (L) 5TH METATARSAL EXOSTECTOMY (67124)   EFFECTIVE DATE: 11/28/23  DEDUCTIBLE: $0.00   REMAINING: $0.00  OOP: $3,900.00  REMAINING:  $3,900.00  CO INSURANCE : 0%   PER THE [employer] WEBSITE NO PRIOR AUTH IS REQ FOR CPT CODE 58099  IPJ#[id number]

## 2024-03-17 ENCOUNTER — Other Ambulatory Visit: Payer: Self-pay | Admitting: Podiatry

## 2024-03-17 DIAGNOSIS — M899 Disorder of bone, unspecified: Secondary | ICD-10-CM | POA: Diagnosis not present

## 2024-03-17 DIAGNOSIS — M898X7 Other specified disorders of bone, ankle and foot: Secondary | ICD-10-CM | POA: Diagnosis not present

## 2024-03-17 DIAGNOSIS — M25775 Osteophyte, left foot: Secondary | ICD-10-CM | POA: Diagnosis not present

## 2024-03-17 MED ORDER — OXYCODONE-ACETAMINOPHEN 5-325 MG PO TABS: 1.0000 | ORAL_TABLET | ORAL | 0 refills | Status: AC | PRN

## 2024-03-17 MED ORDER — IBUPROFEN 800 MG PO TABS: 800.0000 mg | ORAL_TABLET | Freq: Four times a day (QID) | ORAL | 1 refills | Status: AC | PRN

## 2024-03-26 ENCOUNTER — Ambulatory Visit (INDEPENDENT_AMBULATORY_CARE_PROVIDER_SITE_OTHER): Admitting: Podiatry

## 2024-03-26 ENCOUNTER — Ambulatory Visit (INDEPENDENT_AMBULATORY_CARE_PROVIDER_SITE_OTHER)

## 2024-03-26 DIAGNOSIS — M898X9 Other specified disorders of bone, unspecified site: Secondary | ICD-10-CM

## 2024-03-26 DIAGNOSIS — Z9889 Other specified postprocedural states: Secondary | ICD-10-CM

## 2024-03-26 MED ORDER — DOXYCYCLINE HYCLATE 100 MG PO TABS: 100.0000 mg | ORAL_TABLET | Freq: Two times a day (BID) | ORAL | 0 refills | Status: AC

## 2024-03-26 NOTE — Progress Notes (Signed)
 Subjective:  Patient ID: Jorge Long, male    DOB: Jan 04, 1933,  MRN: [id number]  Chief Complaint  Patient presents with   Routine Post Op    DOS 03/17/24 Lt 5TH metatarsal exostectomy    DOS: 03/17/2024 Procedure: Left tarsal exostectomy  89 y.o. male returns for post-op check.  Patient states that he is doing well.  Denies acute complaints.  Bandages clean dry and intact mild erythema noted.  Partial weightbearing to the heel.  Review of Systems: Negative except as noted in the HPI. Denies N/V/F/Ch.  Past Medical History:  Diagnosis Date   Aortic stenosis, moderate    Arrhythmia    BPH (benign prostatic hyperplasia)    Dr. Derrick Fling, UROLOGY   Echocardiogram abnormal 04/03/2017   MILD TO MOD MR, MOD AV REGURG, MILD STENOSIS   GERD (gastroesophageal reflux disease)    Glaucoma    followed by Dr. Trude Furry, [facility]   Heart murmur    Hypercholesteremia    PURE   Hypertension    Thoracic aortic aneurysm without rupture [facility])     Current Outpatient Medications:    doxycycline (VIBRA-TABS) 100 MG tablet, Take 1 tablet (100 mg total) by mouth 2 (two) times daily., Disp: 20 tablet, Rfl: 0   amLODipine  (NORVASC ) 5 MG tablet, Take 5 mg by mouth daily., Disp: , Rfl:    finasteride  (PROSCAR ) 5 MG tablet, Take 5 mg by mouth daily., Disp: , Rfl:    ibuprofen  (ADVIL ) 800 MG tablet, Take 1 tablet (800 mg total) by mouth every 6 (six) hours as needed., Disp: 60 tablet, Rfl: 1   iron polysaccharides (NIFEREX) 150 MG capsule, Take 150 mg by mouth daily., Disp: , Rfl:    latanoprost  (XALATAN ) 0.005 % ophthalmic solution, , Disp: , Rfl:    lisinopril  (PRINIVIL ,ZESTRIL ) 20 MG tablet, Take 20 mg by mouth daily., Disp: , Rfl:    meloxicam (MOBIC) 15 MG tablet, Take 15 mg by mouth daily., Disp: , Rfl:    metoprolol  succinate (TOPROL -XL) 25 MG 24 hr tablet, Take 25 mg by mouth daily., Disp: , Rfl:    mupirocin  ointment (BACTROBAN ) 2 %, APPLY TO AFFECTED AREA TWICE A DAY, Disp: 22 g, Rfl:  12   neomycin -polymyxin-hydrocortisone (CORTISPORIN) OTIC solution, Apply 1-2 drops to toes twice a day, Disp: 10 mL, Rfl: 0   oxyCODONE -acetaminophen  (PERCOCET) 5-325 MG tablet, Take 1 tablet by mouth every 4 (four) hours as needed for severe pain (pain score 7-10)., Disp: 30 tablet, Rfl: 0   ranitidine  (ZANTAC ) 300 MG tablet, Take 300 mg by mouth at bedtime., Disp: , Rfl:    travoprost , benzalkonium, (TRAVATAN ) 0.004 % ophthalmic solution, 1 drop at bedtime., Disp: , Rfl:    triamcinolone cream (KENALOG) 0.1 %, Apply 1 application topically 2 (two) times daily as needed., Disp: , Rfl:   Social History   Tobacco Use  Smoking Status Never  Smokeless Tobacco Never    No Known Allergies Objective:  There were no vitals filed for this visit. There is no height or weight on file to calculate BMI. Constitutional Well developed. Well nourished.  Vascular Foot warm and well perfused. Capillary refill normal to all digits.   Neurologic Normal speech. Oriented to person, place, and time. Epicritic sensation to light touch grossly present bilaterally.  Dermatologic Skin healing well without signs of infection. Skin edges well coapted without signs of infection.  Orthopedic: Tenderness to palpation noted about the surgical site.   Radiographs: 3 views of skeletally mature left foot: Partial  exostectomy f the fifth metatarsal noted.  No other abnormalities noted.  Good correction alignment noted. Assessment:   1. Bony exostosis   2. Status post foot surgery    Plan:  Patient was evaluated and treated and all questions answered.  S/p foot surgery left -Progressing as expected post-operatively. -XR: See above -WB Status: Partial weightbearing to the heel in surgical shoe -Sutures: Intact.  No clinical signs of dehiscence noted no complication noted. -Medications: None -Foot redressed.  No follow-ups on file.

## 2024-04-09 ENCOUNTER — Ambulatory Visit (INDEPENDENT_AMBULATORY_CARE_PROVIDER_SITE_OTHER): Admitting: Podiatry

## 2024-04-09 DIAGNOSIS — M898X9 Other specified disorders of bone, unspecified site: Secondary | ICD-10-CM

## 2024-04-09 DIAGNOSIS — Z9889 Other specified postprocedural states: Secondary | ICD-10-CM

## 2024-04-09 NOTE — Progress Notes (Signed)
 Subjective:  Patient ID: Jorge Long, male    DOB: 04/02/1933,  MRN: [id number]  Chief Complaint  Patient presents with   Routine Post Op    POV # 2 DOS 03/17/24 Lt 5TH metatarsal exostectomy    DOS: 03/17/2024 Procedure: Left tarsal exostectomy  89 y.o. male returns for post-op check.  Patient states that he is doing well.  Denies acute complaints.  Bandages clean dry and intact mild erythema noted.  Partial weightbearing to the heel.  Review of Systems: Negative except as noted in the HPI. Denies N/V/F/Ch.  Past Medical History:  Diagnosis Date   Aortic stenosis, moderate    Arrhythmia    BPH (benign prostatic hyperplasia)    Dr. Derrick Fling, UROLOGY   Echocardiogram abnormal 04/03/2017   MILD TO MOD MR, MOD AV REGURG, MILD STENOSIS   GERD (gastroesophageal reflux disease)    Glaucoma    followed by Dr. Trude Furry, [facility]   Heart murmur    Hypercholesteremia    PURE   Hypertension    Thoracic aortic aneurysm without rupture [facility])     Current Outpatient Medications:    amLODipine  (NORVASC ) 5 MG tablet, Take 5 mg by mouth daily., Disp: , Rfl:    doxycycline  (VIBRA -TABS) 100 MG tablet, Take 1 tablet (100 mg total) by mouth 2 (two) times daily., Disp: 20 tablet, Rfl: 0   finasteride  (PROSCAR ) 5 MG tablet, Take 5 mg by mouth daily., Disp: , Rfl:    ibuprofen  (ADVIL ) 800 MG tablet, Take 1 tablet (800 mg total) by mouth every 6 (six) hours as needed., Disp: 60 tablet, Rfl: 1   iron polysaccharides (NIFEREX) 150 MG capsule, Take 150 mg by mouth daily., Disp: , Rfl:    latanoprost  (XALATAN ) 0.005 % ophthalmic solution, , Disp: , Rfl:    lisinopril  (PRINIVIL ,ZESTRIL ) 20 MG tablet, Take 20 mg by mouth daily., Disp: , Rfl:    meloxicam (MOBIC) 15 MG tablet, Take 15 mg by mouth daily., Disp: , Rfl:    metoprolol  succinate (TOPROL -XL) 25 MG 24 hr tablet, Take 25 mg by mouth daily., Disp: , Rfl:    mupirocin  ointment (BACTROBAN ) 2 %, APPLY TO AFFECTED AREA TWICE A DAY, Disp: 22  g, Rfl: 12   neomycin -polymyxin-hydrocortisone (CORTISPORIN) OTIC solution, Apply 1-2 drops to toes twice a day, Disp: 10 mL, Rfl: 0   oxyCODONE -acetaminophen  (PERCOCET) 5-325 MG tablet, Take 1 tablet by mouth every 4 (four) hours as needed for severe pain (pain score 7-10)., Disp: 30 tablet, Rfl: 0   ranitidine  (ZANTAC ) 300 MG tablet, Take 300 mg by mouth at bedtime., Disp: , Rfl:    travoprost , benzalkonium, (TRAVATAN ) 0.004 % ophthalmic solution, 1 drop at bedtime., Disp: , Rfl:    triamcinolone cream (KENALOG) 0.1 %, Apply 1 application topically 2 (two) times daily as needed., Disp: , Rfl:   Social History   Tobacco Use  Smoking Status Never  Smokeless Tobacco Never    No Known Allergies Objective:  There were no vitals filed for this visit. There is no height or weight on file to calculate BMI. Constitutional Well developed. Well nourished.  Vascular Foot warm and well perfused. Capillary refill normal to all digits.   Neurologic Normal speech. Oriented to person, place, and time. Epicritic sensation to light touch grossly present bilaterally.  Dermatologic Incision healing well.  Superficial Deis is noted no complication noted.  No signs of infection noted.  Orthopedic: Tenderness to palpation noted about the surgical site.   Radiographs: 3 views of  skeletally mature left foot: Partial exostectomy f the fifth metatarsal noted.  No other abnormalities noted.  Good correction alignment noted. Assessment:   No diagnosis found.  Plan:  Patient was evaluated and treated and all questions answered.  S/p foot surgery left -Progressing as expected post-operatively. -XR: See above -WB Status: Partial weightbearing to the heel in surgical shoe -Sutures: removed   superficial dehiscence noted.  No clinical signs of infection noted. -Medications: None - Encouraged him to do Betadine wet-to-dry dressing  No follow-ups on file.

## 2024-04-22 DIAGNOSIS — R5383 Other fatigue: Secondary | ICD-10-CM | POA: Diagnosis not present

## 2024-04-30 ENCOUNTER — Ambulatory Visit: Admitting: Podiatry

## 2024-04-30 ENCOUNTER — Ambulatory Visit (INDEPENDENT_AMBULATORY_CARE_PROVIDER_SITE_OTHER)

## 2024-04-30 DIAGNOSIS — M898X9 Other specified disorders of bone, unspecified site: Secondary | ICD-10-CM | POA: Diagnosis not present

## 2024-04-30 DIAGNOSIS — Z9889 Other specified postprocedural states: Secondary | ICD-10-CM

## 2024-04-30 NOTE — Progress Notes (Signed)
 Subjective:  Patient ID: Jorge Long, male    DOB: [birth year],  MRN: [id number]  Chief Complaint  Patient presents with   Routine Post Op    POV #3 DOS 03/17/24 Lt 5TH metatarsal exostectomy    DOS: 03/17/2024 Procedure: Left tarsal exostectomy  89 y.o. male returns for post-op check.  Patient states that he is doing well.  Denies acute complaints.  Bandages clean dry and intact mild erythema noted.  Partial weightbearing to the heel.  Review of Systems: Negative except as noted in the HPI. Denies N/V/F/Ch.  Past Medical History:  Diagnosis Date   Aortic stenosis, moderate    Arrhythmia    BPH (benign prostatic hyperplasia)    Dr. Derrick Fling, UROLOGY   Echocardiogram abnormal 04/03/2017   MILD TO MOD MR, MOD AV REGURG, MILD STENOSIS   GERD (gastroesophageal reflux disease)    Glaucoma    followed by Dr. Trude Furry, [facility]   Heart murmur    Hypercholesteremia    PURE   Hypertension    Thoracic aortic aneurysm without rupture [facility])     Current Outpatient Medications:    amLODipine  (NORVASC ) 5 MG tablet, Take 5 mg by mouth daily., Disp: , Rfl:    doxycycline  (VIBRA -TABS) 100 MG tablet, Take 1 tablet (100 mg total) by mouth 2 (two) times daily., Disp: 20 tablet, Rfl: 0   finasteride  (PROSCAR ) 5 MG tablet, Take 5 mg by mouth daily., Disp: , Rfl:    ibuprofen  (ADVIL ) 800 MG tablet, Take 1 tablet (800 mg total) by mouth every 6 (six) hours as needed., Disp: 60 tablet, Rfl: 1   iron polysaccharides (NIFEREX) 150 MG capsule, Take 150 mg by mouth daily., Disp: , Rfl:    latanoprost  (XALATAN ) 0.005 % ophthalmic solution, , Disp: , Rfl:    lisinopril  (PRINIVIL ,ZESTRIL ) 20 MG tablet, Take 20 mg by mouth daily., Disp: , Rfl:    meloxicam (MOBIC) 15 MG tablet, Take 15 mg by mouth daily., Disp: , Rfl:    metoprolol  succinate (TOPROL -XL) 25 MG 24 hr tablet, Take 25 mg by mouth daily., Disp: , Rfl:    mupirocin  ointment (BACTROBAN ) 2 %, APPLY TO AFFECTED AREA TWICE A DAY, Disp: 22  g, Rfl: 12   neomycin -polymyxin-hydrocortisone (CORTISPORIN) OTIC solution, Apply 1-2 drops to toes twice a day, Disp: 10 mL, Rfl: 0   oxyCODONE -acetaminophen  (PERCOCET) 5-325 MG tablet, Take 1 tablet by mouth every 4 (four) hours as needed for severe pain (pain score 7-10)., Disp: 30 tablet, Rfl: 0   ranitidine  (ZANTAC ) 300 MG tablet, Take 300 mg by mouth at bedtime., Disp: , Rfl:    travoprost , benzalkonium, (TRAVATAN ) 0.004 % ophthalmic solution, 1 drop at bedtime., Disp: , Rfl:    triamcinolone cream (KENALOG) 0.1 %, Apply 1 application topically 2 (two) times daily as needed., Disp: , Rfl:   Social History   Tobacco Use  Smoking Status Never  Smokeless Tobacco Never    No Known Allergies Objective:  There were no vitals filed for this visit. There is no height or weight on file to calculate BMI. Constitutional Well developed. Well nourished.  Vascular Foot warm and well perfused. Capillary refill normal to all digits.   Neurologic Normal speech. Oriented to person, place, and time. Epicritic sensation to light touch grossly present bilaterally.  Dermatologic Incision healing well.  No further dehiscence noted no complication noted reduction of pressure noted.  No other abnormalities identified  Orthopedic: No further tenderness to palpation noted about the surgical site.  Radiographs: 3 views of skeletally mature left foot: Partial exostectomy f the fifth metatarsal noted.  No other abnormalities noted.  Good correction alignment noted. Assessment:   1. Bony exostosis   2. Status post foot surgery     Plan:  Patient was evaluated and treated and all questions answered.  S/p foot surgery left - Clinically healed skin completely epithelialized.  Good reduction of submetatarsal fifth met base pressure noted.  At this time patient to return to regular activities no restriction.  If any foot and ankle issues in the future come back and see me.  No follow-ups on file.

## 2024-05-13 DIAGNOSIS — H5212 Myopia, left eye: Secondary | ICD-10-CM | POA: Diagnosis not present

## 2024-05-13 DIAGNOSIS — H353131 Nonexudative age-related macular degeneration, bilateral, early dry stage: Secondary | ICD-10-CM | POA: Diagnosis not present

## 2024-05-13 DIAGNOSIS — H524 Presbyopia: Secondary | ICD-10-CM | POA: Diagnosis not present

## 2024-05-13 DIAGNOSIS — H50812 Duane's syndrome, left eye: Secondary | ICD-10-CM | POA: Diagnosis not present

## 2024-05-13 DIAGNOSIS — H53412 Scotoma involving central area, left eye: Secondary | ICD-10-CM | POA: Diagnosis not present

## 2024-05-13 DIAGNOSIS — H401132 Primary open-angle glaucoma, bilateral, moderate stage: Secondary | ICD-10-CM | POA: Diagnosis not present

## 2024-06-19 DIAGNOSIS — M159 Polyosteoarthritis, unspecified: Secondary | ICD-10-CM | POA: Diagnosis not present

## 2024-06-19 DIAGNOSIS — M1711 Unilateral primary osteoarthritis, right knee: Secondary | ICD-10-CM | POA: Diagnosis not present

## 2024-08-06 ENCOUNTER — Ambulatory Visit (INDEPENDENT_AMBULATORY_CARE_PROVIDER_SITE_OTHER): Admitting: Podiatry

## 2024-08-06 DIAGNOSIS — M216X2 Other acquired deformities of left foot: Secondary | ICD-10-CM

## 2024-08-06 NOTE — Progress Notes (Signed)
 Subjective:  Patient ID: Jorge Long, male    DOB: 08-Jun-1933,  MRN: [id number]  Chief Complaint  Patient presents with   Foot Pain    Pt stated that his foot is doing fine he just wanted to see why it was leaning to the side when he sits down     89 y.o. male presents with the above complaint.  Patient presents with complaint of left cavovarus foot structure.  Patient states the foot is rotating in a little bit and is causing him some discomfort wanted to discuss treatment options for this he currently does not wear any bracing.  Pain scale is 5 out of 10 dull aching nature his callus has not formed back up again.   Review of Systems: Negative except as noted in the HPI. Denies N/V/F/Ch.  Past Medical History:  Diagnosis Date   Aortic stenosis, moderate    Arrhythmia    BPH (benign prostatic hyperplasia)    Dr. Nieves, UROLOGY   Echocardiogram abnormal 04/03/2017   MILD TO MOD MR, MOD AV REGURG, MILD STENOSIS   GERD (gastroesophageal reflux disease)    Glaucoma    followed by Dr. Jeremiah, [facility]   Heart murmur    Hypercholesteremia    PURE   Hypertension    Thoracic aortic aneurysm without rupture [facility])     Current Outpatient Medications:    amLODipine  (NORVASC ) 5 MG tablet, Take 5 mg by mouth daily., Disp: , Rfl:    doxycycline  (VIBRA -TABS) 100 MG tablet, Take 1 tablet (100 mg total) by mouth 2 (two) times daily., Disp: 20 tablet, Rfl: 0   finasteride  (PROSCAR ) 5 MG tablet, Take 5 mg by mouth daily., Disp: , Rfl:    ibuprofen  (ADVIL ) 800 MG tablet, Take 1 tablet (800 mg total) by mouth every 6 (six) hours as needed., Disp: 60 tablet, Rfl: 1   iron polysaccharides (NIFEREX) 150 MG capsule, Take 150 mg by mouth daily., Disp: , Rfl:    latanoprost  (XALATAN ) 0.005 % ophthalmic solution, , Disp: , Rfl:    lisinopril  (PRINIVIL ,ZESTRIL ) 20 MG tablet, Take 20 mg by mouth daily., Disp: , Rfl:    meloxicam (MOBIC) 15 MG tablet, Take 15 mg by mouth daily., Disp: ,  Rfl:    metoprolol  succinate (TOPROL -XL) 25 MG 24 hr tablet, Take 25 mg by mouth daily., Disp: , Rfl:    mupirocin  ointment (BACTROBAN ) 2 %, APPLY TO AFFECTED AREA TWICE A DAY, Disp: 22 g, Rfl: 12   neomycin -polymyxin-hydrocortisone (CORTISPORIN) OTIC solution, Apply 1-2 drops to toes twice a day, Disp: 10 mL, Rfl: 0   oxyCODONE -acetaminophen  (PERCOCET) 5-325 MG tablet, Take 1 tablet by mouth every 4 (four) hours as needed for severe pain (pain score 7-10)., Disp: 30 tablet, Rfl: 0   ranitidine  (ZANTAC ) 300 MG tablet, Take 300 mg by mouth at bedtime., Disp: , Rfl:    travoprost , benzalkonium, (TRAVATAN ) 0.004 % ophthalmic solution, 1 drop at bedtime., Disp: , Rfl:    triamcinolone cream (KENALOG) 0.1 %, Apply 1 application topically 2 (two) times daily as needed., Disp: , Rfl:   Social History   Tobacco Use  Smoking Status Never  Smokeless Tobacco Never    No Known Allergies Objective:  There were no vitals filed for this visit. There is no height or weight on file to calculate BMI. Constitutional Well developed. Well nourished.  Vascular Dorsalis pedis pulses palpable bilaterally. Posterior tibial pulses palpable bilaterally. Capillary refill normal to all digits.  No cyanosis or clubbing noted.  Pedal hair growth normal.  Neurologic Normal speech. Oriented to person, place, and time. Epicritic sensation to light touch grossly present bilaterally.  Dermatologic Nails well groomed and normal in appearance. No open wounds. No skin lesions.  Orthopedic: Pain on palpation to the left lateral foot.  Cavovarus foot structure noted semirigid in nature.  Pes cavus foot type noted.  Negative ATFL sprain noted.   Radiographs: NOne Assessment:   1. Cavovarus deformity of foot, acquired, left    Plan:  Patient was evaluated and treated and all questions answered.  Left cavovarus foot structure - All questions and concerns were discussed with the patient in extensive detail given the  foot structure that he is experiencing he will benefit from AFO bracing he will see Trish for AFO bracing - For now Tri-Lock ankle brace was dispensed to give him temporary stability while ambulating. - Shoe gear modification discussed  No follow-ups on file.

## 2024-09-01 ENCOUNTER — Ambulatory Visit

## 2024-09-01 NOTE — Progress Notes (Signed)
 Patient is refusing AFO as his oop would be around $250, I did reinstruck on the Lace up ankle brace as he says this brace is doing just fine! '  Lolita Schultze CPed, CFo, CFm

## 2024-11-07 ENCOUNTER — Other Ambulatory Visit: Payer: Self-pay

## 2024-11-07 DIAGNOSIS — I7121 Aneurysm of the ascending aorta, without rupture: Secondary | ICD-10-CM

## 2024-12-02 ENCOUNTER — Ambulatory Visit
